# Patient Record
Sex: Female | Born: 2000 | Race: Black or African American | Hispanic: No | Marital: Single | Smoking: Never smoker
Health system: Southern US, Community
[De-identification: ages and names within clinical notes are randomized; demographics above are authoritative.]

## PROBLEM LIST (undated history)

## (undated) HISTORY — PX: OTHER SURGICAL HISTORY: SHX169

## (undated) HISTORY — PX: GASTRIC BYPASS: SHX52

---
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
## 2020-01-13 ENCOUNTER — Encounter (HOSPITAL_COMMUNITY): Payer: Self-pay

## 2020-01-13 ENCOUNTER — Emergency Department (HOSPITAL_COMMUNITY): Payer: Medicaid Other

## 2020-01-13 ENCOUNTER — Inpatient Hospital Stay (HOSPITAL_COMMUNITY)
Admission: EM | Admit: 2020-01-13 | Discharge: 2020-01-17 | DRG: 759 | Disposition: A | Discharge status: Home / Self Care | Payer: Medicaid Other | Attending: Obstetrics and Gynecology | Admitting: Obstetrics and Gynecology

## 2020-01-13 ENCOUNTER — Other Ambulatory Visit: Payer: Self-pay

## 2020-01-13 DIAGNOSIS — Z9884 Bariatric surgery status: Secondary | ICD-10-CM | POA: Diagnosis not present

## 2020-01-13 DIAGNOSIS — R1030 Lower abdominal pain, unspecified: Secondary | ICD-10-CM | POA: Diagnosis not present

## 2020-01-13 DIAGNOSIS — N7093 Salpingitis and oophoritis, unspecified: Secondary | ICD-10-CM | POA: Diagnosis present

## 2020-01-13 DIAGNOSIS — R102 Pelvic and perineal pain: Secondary | ICD-10-CM

## 2020-01-13 DIAGNOSIS — Z20822 Contact with and (suspected) exposure to covid-19: Secondary | ICD-10-CM | POA: Diagnosis present

## 2020-01-13 DIAGNOSIS — K5900 Constipation, unspecified: Secondary | ICD-10-CM | POA: Diagnosis present

## 2020-01-13 LAB — URINALYSIS, ROUTINE W REFLEX MICROSCOPIC
Bilirubin Urine: NEGATIVE
Glucose, UA: NEGATIVE mg/dL
Ketones, ur: NEGATIVE mg/dL
Leukocytes,Ua: NEGATIVE
Nitrite: NEGATIVE
Protein, ur: NEGATIVE mg/dL
Specific Gravity, Urine: 1.016 (ref 1.005–1.030)
pH: 5 (ref 5.0–8.0)

## 2020-01-13 LAB — CBC
HCT: 39.8 % (ref 36.0–46.0)
Hemoglobin: 13.1 g/dL (ref 12.0–15.0)
MCH: 28.5 pg (ref 26.0–34.0)
MCHC: 32.9 g/dL (ref 30.0–36.0)
MCV: 86.7 fL (ref 80.0–100.0)
Platelets: 207 10*3/uL (ref 150–400)
RBC: 4.59 MIL/uL (ref 3.87–5.11)
RDW: 12.4 % (ref 11.5–15.5)
WBC: 13.5 10*3/uL — ABNORMAL HIGH (ref 4.0–10.5)
nRBC: 0 % (ref 0.0–0.2)

## 2020-01-13 LAB — COMPREHENSIVE METABOLIC PANEL
ALT: 15 U/L (ref 0–44)
AST: 15 U/L (ref 15–41)
Albumin: 4.1 g/dL (ref 3.5–5.0)
Alkaline Phosphatase: 82 U/L (ref 38–126)
Anion gap: 9 (ref 5–15)
BUN: 7 mg/dL (ref 6–20)
CO2: 25 mmol/L (ref 22–32)
Calcium: 8.9 mg/dL (ref 8.9–10.3)
Chloride: 99 mmol/L (ref 98–111)
Creatinine, Ser: 0.8 mg/dL (ref 0.44–1.00)
GFR calc Af Amer: >60 mL/min (ref >60)
GFR calc non Af Amer: >60 mL/min (ref >60)
Glucose, Bld: 108 mg/dL — ABNORMAL HIGH (ref 70–99)
Potassium: 3.5 mmol/L (ref 3.5–5.1)
Sodium: 133 mmol/L — ABNORMAL LOW (ref 135–145)
Total Bilirubin: 2.2 mg/dL — ABNORMAL HIGH (ref 0.3–1.2)
Total Protein: 8 g/dL (ref 6.5–8.1)

## 2020-01-13 LAB — RESPIRATORY PANEL BY RT PCR (FLU A&B, COVID)
Influenza A by PCR: NEGATIVE
Influenza B by PCR: NEGATIVE
SARS Coronavirus 2 by RT PCR: NEGATIVE

## 2020-01-13 LAB — WET PREP, GENITAL
Sperm: NONE SEEN
Trich, Wet Prep: NONE SEEN
Yeast Wet Prep HPF POC: NONE SEEN

## 2020-01-13 LAB — I-STAT BETA HCG BLOOD, ED (MC, WL, AP ONLY): I-stat hCG, quantitative: <5 m[IU]/mL (ref <5)

## 2020-01-13 LAB — LACTIC ACID, PLASMA: Lactic Acid, Venous: 1.7 mmol/L (ref 0.5–1.9)

## 2020-01-13 LAB — LIPASE, BLOOD: Lipase: 19 U/L (ref 11–51)

## 2020-01-13 MED ORDER — SODIUM CHLORIDE 0.9% FLUSH: 3.0000 mL | Freq: Once | INTRAVENOUS | Status: DC

## 2020-01-13 MED ORDER — PIPERACILLIN-TAZOBACTAM 3.375 G IVPB 30 MIN
3.3750 g | Freq: Once | INTRAVENOUS | Status: AC
  Administered 2020-01-13: 3.375 g via INTRAVENOUS
  Filled 2020-01-13: qty 50

## 2020-01-13 MED ORDER — DOXYCYCLINE HYCLATE 100 MG PO TABS: 100.0000 mg | ORAL_TABLET | Freq: Two times a day (BID) | ORAL | Status: DC

## 2020-01-13 MED ORDER — DOCUSATE SODIUM 100 MG PO CAPS
100.0000 mg | ORAL_CAPSULE | Freq: Two times a day (BID) | ORAL | Status: DC
  Administered 2020-01-14 – 2020-01-17 (×7): 100 mg via ORAL
  Filled 2020-01-13 (×7): qty 1

## 2020-01-13 MED ORDER — ONDANSETRON 4 MG PO TBDP
4.0000 mg | ORAL_TABLET | Freq: Once | ORAL | Status: AC
  Administered 2020-01-13: 4 mg via ORAL
  Filled 2020-01-13: qty 1

## 2020-01-13 MED ORDER — SODIUM CHLORIDE 0.9 % IV SOLN: 1.0000 g | Freq: Once | INTRAVENOUS | Status: DC

## 2020-01-13 MED ORDER — SODIUM CHLORIDE 0.9 % IV BOLUS
1000.0000 mL | Freq: Once | INTRAVENOUS | Status: AC
  Administered 2020-01-13: 1000 mL via INTRAVENOUS

## 2020-01-13 MED ORDER — ONDANSETRON HCL 4 MG PO TABS
4.0000 mg | ORAL_TABLET | Freq: Four times a day (QID) | ORAL | Status: DC | PRN
  Administered 2020-01-14 – 2020-01-15 (×3): 4 mg via ORAL
  Filled 2020-01-13 (×3): qty 1

## 2020-01-13 MED ORDER — ONDANSETRON HCL 4 MG/2ML IJ SOLN
4.0000 mg | Freq: Four times a day (QID) | INTRAMUSCULAR | Status: DC | PRN
  Administered 2020-01-15: 4 mg via INTRAVENOUS
  Filled 2020-01-13: qty 2

## 2020-01-13 MED ORDER — HYDROCODONE-ACETAMINOPHEN 5-325 MG PO TABS
1.0000 | ORAL_TABLET | ORAL | Status: DC | PRN
  Administered 2020-01-14 – 2020-01-15 (×5): 2 via ORAL
  Filled 2020-01-13 (×5): qty 2

## 2020-01-13 MED ORDER — DOXYCYCLINE HYCLATE 100 MG PO TABS
100.0000 mg | ORAL_TABLET | Freq: Two times a day (BID) | ORAL | Status: DC
  Administered 2020-01-13 – 2020-01-17 (×8): 100 mg via ORAL
  Filled 2020-01-13 (×9): qty 1

## 2020-01-13 MED ORDER — ACETAMINOPHEN 500 MG PO TABS
1000.0000 mg | ORAL_TABLET | Freq: Once | ORAL | Status: AC
  Administered 2020-01-13: 1000 mg via ORAL
  Filled 2020-01-13: qty 2

## 2020-01-13 MED ORDER — ALUM & MAG HYDROXIDE-SIMETH 200-200-20 MG/5ML PO SUSP: 30.0000 mL | ORAL | Status: DC | PRN

## 2020-01-13 MED ORDER — LACTATED RINGERS IV SOLN: INTRAVENOUS | Status: DC

## 2020-01-13 MED ORDER — MORPHINE SULFATE (PF) 2 MG/ML IV SOLN
1.0000 mg | INTRAVENOUS | Status: DC | PRN
  Administered 2020-01-14: 2 mg via INTRAVENOUS
  Administered 2020-01-14: 1 mg via INTRAVENOUS
  Administered 2020-01-14 – 2020-01-17 (×7): 2 mg via INTRAVENOUS
  Filled 2020-01-13 (×10): qty 1

## 2020-01-13 MED ORDER — IOHEXOL 300 MG/ML  SOLN
100.0000 mL | Freq: Once | INTRAMUSCULAR | Status: AC | PRN
  Administered 2020-01-13: 100 mL via INTRAVENOUS

## 2020-01-13 MED ORDER — IBUPROFEN 800 MG PO TABS
800.0000 mg | ORAL_TABLET | Freq: Once | ORAL | Status: AC
  Administered 2020-01-13: 800 mg via ORAL
  Filled 2020-01-13: qty 1

## 2020-01-13 MED ORDER — SIMETHICONE 80 MG PO CHEW
80.0000 mg | CHEWABLE_TABLET | Freq: Four times a day (QID) | ORAL | Status: DC | PRN
  Administered 2020-01-14: 80 mg via ORAL
  Filled 2020-01-13 (×2): qty 1

## 2020-01-13 MED ORDER — ACETAMINOPHEN 325 MG PO TABS: 650.0000 mg | ORAL_TABLET | ORAL | Status: DC | PRN

## 2020-01-13 MED ORDER — SODIUM CHLORIDE (PF) 0.9 % IJ SOLN
INTRAMUSCULAR | Status: AC
  Filled 2020-01-13: qty 50

## 2020-01-13 MED ORDER — FENTANYL CITRATE (PF) 100 MCG/2ML IJ SOLN
50.0000 ug | Freq: Once | INTRAMUSCULAR | Status: AC
  Administered 2020-01-13: 50 ug via INTRAVENOUS
  Filled 2020-01-13: qty 2

## 2020-01-13 MED ORDER — SODIUM CHLORIDE 0.9 % IV SOLN
2.0000 g | Freq: Four times a day (QID) | INTRAVENOUS | Status: DC
  Administered 2020-01-13: 2 g via INTRAVENOUS
  Filled 2020-01-13 (×2): qty 2

## 2020-01-13 NOTE — ED Provider Notes (Signed)
[facility] DEPT Provider Note   CSN: [id number] Arrival date & time: 01/13/20  1527     History Chief Complaint  Patient presents with  . Abdominal Pain    Jennifer Merritt is a 19 y.o. female.  Who presents emergency department with a chief complaint of abdominal pain.  She is status post gastric bypass surgery in 2019 and abdominoplasty in the summer 2020.  The patient states that for the past 3 days she has been having abdominal pain.  She felt like she may have eaten too much on Friday.  She had pain in the epigastrium which has now migrated to her lower abdomen.  She states that the pain has progressively worsened.  She was not making a bowel movement and has not had much to eat or drink over the past 24 hours.  She took magnesium citrate last night and had of voluminous watery stools overnight.  This morning her pain became severe.  She states it hurts to lay flat, move, take deep breaths.  She states that any small jostling of her abdomen hurts extremely.  She denies any urinary symptoms but does feel like she may have some lower back pain.  She has been having shaking chills did not realize that she had a fever prior to arrival.  She denies hematuria or burning with urination.  Patient denies any recent sexual activity and is only sexually active with females.  She has recent STD testing and denies any vaginal symptoms. She has had nausea without vomiting.  HPI     History reviewed. No pertinent past medical history.  There are no problems to display for this patient.   Past Surgical History:  Procedure Laterality Date  . GASTRIC BYPASS    . tummy tuck       OB History   No obstetric history on file.     Family History  Problem Relation Age of Onset  . Hypertension Mother     Social History   Tobacco Use  . Smoking status: Never Smoker  . Smokeless tobacco: Never Used  Substance Use Topics  . Alcohol use: Never  . Drug use: Yes   Types: Marijuana    Home Medications Prior to Admission medications   Not on File    Allergies    Patient has no known allergies.  Review of Systems   Review of Systems Ten systems reviewed and are negative for acute change, except as noted in the HPI.  Physical Exam Updated Vital Signs BP (!) 124/103 (BP Location: Left Arm)   Pulse (!) 102   Temp (!) 101.2 F (38.4 C) (Oral)   Resp 16   Ht 5\' 1"  (1.549 m)   Wt 82.6 kg   LMP 01/06/2020   SpO2 94%   BMI 34.39 kg/m   Physical Exam Vitals and nursing note reviewed.  Constitutional:      Appearance: She is well-developed.  HENT:     Head: Normocephalic and atraumatic.     Mouth/Throat:     Mouth: Mucous membranes are moist.  Eyes:     Extraocular Movements: Extraocular movements intact.     Pupils: Pupils are equal, round, and reactive to light.  Cardiovascular:     Rate and Rhythm: Tachycardia present.     Heart sounds: Murmur present.  Pulmonary:     Effort: Pulmonary effort is normal.     Breath sounds: Normal breath sounds.  Abdominal:     General: Abdomen is flat.  A surgical scar is present. There is no distension.     Palpations: Abdomen is soft.     Tenderness: There is generalized abdominal tenderness. There is guarding and rebound. There is no right CVA tenderness or left CVA tenderness.     Comments: Patient with lower abdominal scar and umbilical scar consistent with previous abdominoplasty.  She has generalized abdominal tenderness, guarding, she is exquisitely tender with even the lightest palpation.  Positive peritoneal signs.  Neurological:     Mental Status: She is alert.     ED Results / Procedures / Treatments   Labs (all labs ordered are listed, but only abnormal results are displayed) Labs Reviewed  RESPIRATORY PANEL BY RT PCR (FLU A&B, COVID)  LIPASE, BLOOD  COMPREHENSIVE METABOLIC PANEL  CBC  URINALYSIS, ROUTINE W REFLEX MICROSCOPIC  LACTIC ACID, PLASMA  I-STAT BETA HCG BLOOD, ED ([facility],  [facility], [facility] ONLY)    EKG None  Radiology No results found.  Procedures Procedures (including critical care time)  Medications Ordered in ED Medications  piperacillin-tazobactam (ZOSYN) IVPB 3.375 g (3.375 g Intravenous New Bag/Given 01/13/20 1719)  sodium chloride 0.9 % bolus 1,000 mL (1,000 mLs Intravenous New Bag/Given 01/13/20 1719)  acetaminophen (TYLENOL) tablet 1,000 mg (1,000 mg Oral Given 01/13/20 1719)  ondansetron (ZOFRAN-ODT) disintegrating tablet 4 mg (4 mg Oral Given 01/13/20 1719)  fentaNYL (SUBLIMAZE) injection 50 mcg (50 mcg Intravenous Given 01/13/20 1719)    ED Course  I have reviewed the triage vital signs and the nursing notes.  Pertinent labs & imaging results that were available during my care of the patient were reviewed by me and considered in my medical decision making (see chart for details).  Clinical Course as of Jan 13 17  Mon Jan 13, 2020  1750 WBC(!): 13.5 [AH]  1750 Bacteria, UA(!): RARE [AH]  1750 Hgb urine dipstick(!): SMALL [AH]  1750 Appearance(!): HAZY [AH]  1809 Total Bilirubin(!): 2.2 [AH]  2028 US PELVIC COMPLETE W TRANSVAGINAL AND TORSION R/O [AH]    Clinical Course User Index [AH] Arthor Captain, PA-C   MDM Rules/Calculators/A&P                      5:26 PM BP (!) 128/98   Pulse 90   Temp (!) 101.2 F (38.4 C) (Oral)   Resp 16   Ht 5\' 1"  (1.549 m)   Wt 82.6 kg   LMP 01/06/2020   SpO2 96%   BMI 34.58 kg/m  19 year old female here with abdominal pain.  She is running a fever of 101.2.  Her physical exam is concerning for acute abdomen/surgical abdomen.  She has exquisite tenderness to palpation and positive peritoneal signs.  I started the patient on fluids given oral Tylenol, pain medication, antinausea medication and Zosyn.    CC: Abdominal pain VS:  Vitals:   01/13/20 2100 01/13/20 2137 01/13/20 2253 01/13/20 2300  BP:  (!) 113/49 (!) 104/39 (!) 102/55  Pulse:  100 81 80  Resp:  18 18   Temp: (!) 100.4 F (38 C)       TempSrc: Oral     SpO2:  100% 99% 100%  Weight:      Height:        03/14/20 is gathered by patient and EMR. Previous records obtained and reviewed. DDX:The patient's complaint of abdominal pain involves an extensive number of diagnostic and treatment options, and is a complaint that carries with it a high risk of complications, morbidity, and potential  mortality. Given the large differential diagnosis, medical decision making is of high complexity. Differential diagnosis of her lower abdominal considerations include pelvic inflammatory disease, ectopic pregnancy, appendicitis, urinary calculi, primary dysmenorrhea, septic abortion, ruptured ovarian cyst or tumor, ovarian torsion, tubo-ovarian abscess, degeneration of fibroid, endometriosis, diverticulitis, cystitis. Labs: I ordered reviewed and interpreted labs which include i-STAT hCG which is negative for acute abnormality.  CMP shows slightly low sodium of insignificant value.  CBC with elevated white blood cell count.  Patient with a mildly elevated total bilirubin.  Urine shows no acute abnormalities.  Covid test negative.  Wet prep collected and shows clue cells and many white blood cells. Imaging: I ordered and reviewed images which included CT abdomen and pelvis with contrast along with ultrasound of the pelvis with transvaginal duplex. I independently visualized and interpreted all imaging. Significant findings include CT scan shows complex rim-enhancing lesions within the bilateral ovaries.  This is also confirmed on pelvic ultrasound which is showing also a hydrosalpinx concerning for tubo-ovarian abscess EKG: Consults: Case discussed with Dr. Jeanann Lewandowsky OB/GYN who will admit the patient. MDM: Patient here with severe pelvic pain, fever, peritoneal symptoms.  Initially had concern for potential ruptured appendicitis however CT concerning for pelvic organ dysfunction function.  Exquisitely tender on pelvic examination and ultrasound confirms  suspected tubo-ovarian abscess.  Patient started on cefoxitin and oral doxycycline.  Continued on pain medications.  She will be admitted for further management.  I have discussed all findings with the patient. Patient disposition: Admit The patient appears reasonably stabilized for admission considering the current resources, flow, and capabilities available in the ED at this time, and I doubt any other [facility] requiring further screening and/or treatment in the ED prior to admission.       ' Final Clinical Impression(s) / ED Diagnoses Final diagnoses:  None    Rx / DC Orders ED Discharge Orders    None       Arthor Captain, PA-C 01/14/20 Dorene Ar, MD 01/14/20 1510

## 2020-01-13 NOTE — ED Triage Notes (Signed)
Patient c/o lower abdominal pain x 2 days. Patient states she was constipated x 2 days as well, but had a BM last night after taking Mag Citrate. patient denies any N/V. Patient went to the [facility] today and was advised to come to the ED  Patient had a Negative Rapid Covid and they also obtained another Covid test and told her her results should be ready tomorrow or Wednesday.Marland Kitchen

## 2020-01-14 DIAGNOSIS — N7093 Salpingitis and oophoritis, unspecified: Principal | ICD-10-CM

## 2020-01-14 LAB — CBC
HCT: 37.6 % (ref 36.0–46.0)
Hemoglobin: 12.1 g/dL (ref 12.0–15.0)
MCH: 28.5 pg (ref 26.0–34.0)
MCHC: 32.2 g/dL (ref 30.0–36.0)
MCV: 88.7 fL (ref 80.0–100.0)
Platelets: 198 10*3/uL (ref 150–400)
RBC: 4.24 MIL/uL (ref 3.87–5.11)
RDW: 12.7 % (ref 11.5–15.5)
WBC: 11 10*3/uL — ABNORMAL HIGH (ref 4.0–10.5)
nRBC: 0 % (ref 0.0–0.2)

## 2020-01-14 MED ORDER — METRONIDAZOLE 500 MG PO TABS
500.0000 mg | ORAL_TABLET | Freq: Two times a day (BID) | ORAL | Status: DC
  Administered 2020-01-14 – 2020-01-17 (×7): 500 mg via ORAL
  Filled 2020-01-14 (×7): qty 1

## 2020-01-14 MED ORDER — SODIUM CHLORIDE 0.9 % IV SOLN
2.0000 g | Freq: Two times a day (BID) | INTRAVENOUS | Status: DC
  Administered 2020-01-14 – 2020-01-17 (×7): 2 g via INTRAVENOUS
  Filled 2020-01-14 (×8): qty 2

## 2020-01-14 MED ORDER — ZOLPIDEM TARTRATE 5 MG PO TABS: 5.0000 mg | ORAL_TABLET | Freq: Every evening | ORAL | Status: DC | PRN

## 2020-01-14 NOTE — H&P (Signed)
Jennifer Merritt is an 19 y.o. female who started having lower abd pain over the weekend. Pain increased and she presented to [facility] ED for eval. Eval revealed bilateral TOA's. Jennifer Merritt was transfer to our service for treatment.  Denies Bladder dysfunction. Some problems with constipation.   Jennifer Merritt sexual active, in same sex relationship Denies H/O STD's Nulligravid  LMP 01/05/20  Menstrual History: Menarche age: 54 Patient's last menstrual period was 01/06/2020.    History reviewed. No pertinent past medical history.  Past Surgical History:  Procedure Laterality Date  . GASTRIC BYPASS    . tummy tuck      Family History  Problem Relation Age of Onset  . Hypertension Mother     Social History:  reports that she has never smoked. She has never used smokeless tobacco. She reports current drug use. Drug: Marijuana. She reports that she does not drink alcohol.  Allergies: No Known Allergies  No medications prior to admission.    Review of Systems  Constitutional: Positive for chills and fever.  Respiratory: Negative.   Cardiovascular: Negative.   Gastrointestinal: Positive for abdominal pain and constipation.  Genitourinary: Positive for pelvic pain.    Blood pressure 114/78, pulse (!) 104, temperature (!) 100.4 F (38 C), temperature source Oral, resp. rate 18, height 5\' 1"  (1.549 m), weight 82.1 kg, last menstrual period 01/06/2020, SpO2 100 %. Physical Exam  Constitutional: She appears well-developed and well-nourished.  Cardiovascular: Normal rate and regular rhythm.  Respiratory: Effort normal and breath sounds normal.  GI: Soft. Bowel sounds are normal.  Diffuse lower abd tenderness no rebound or gaurding  Genitourinary:    Genitourinary Comments: See ER exam     Results for orders placed or performed during the hospital encounter of 01/13/20 (from the past 24 hour(s))  Urinalysis, Routine w reflex microscopic     Status: Abnormal   Collection Time: 01/13/20  4:59 PM   Result Value Ref Range   Color, Urine YELLOW YELLOW   APPearance HAZY (A) CLEAR   Specific Gravity, Urine 1.016 1.005 - 1.030   pH 5.0 5.0 - 8.0   Glucose, UA NEGATIVE NEGATIVE mg/dL   Hgb urine dipstick SMALL (A) NEGATIVE   Bilirubin Urine NEGATIVE NEGATIVE   Ketones, ur NEGATIVE NEGATIVE mg/dL   Protein, ur NEGATIVE NEGATIVE mg/dL   Nitrite NEGATIVE NEGATIVE   Leukocytes,Ua NEGATIVE NEGATIVE   RBC / HPF 0-5 0 - 5 RBC/hpf   WBC, UA 6-10 0 - 5 WBC/hpf   Bacteria, UA RARE (A) NONE SEEN   Squamous Epithelial / LPF 6-10 0 - 5   Mucus PRESENT   Respiratory Panel by RT PCR (Flu A&B, Covid) - Nasopharyngeal Swab     Status: None   Collection Time: 01/13/20  5:18 PM   Specimen: Nasopharyngeal Swab  Result Value Ref Range   SARS Coronavirus 2 by RT PCR NEGATIVE NEGATIVE   Influenza A by PCR NEGATIVE NEGATIVE   Influenza B by PCR NEGATIVE NEGATIVE  Lipase, blood     Status: None   Collection Time: 01/13/20  5:21 PM  Result Value Ref Range   Lipase 19 11 - 51 U/L  Comprehensive metabolic panel     Status: Abnormal   Collection Time: 01/13/20  5:21 PM  Result Value Ref Range   Sodium 133 (L) 135 - 145 mmol/L   Potassium 3.5 3.5 - 5.1 mmol/L   Chloride 99 98 - 111 mmol/L   CO2 25 22 - 32 mmol/L   Glucose, Bld 108 (  H) 70 - 99 mg/dL   BUN 7 6 - 20 mg/dL   Creatinine, Ser 0.80 0.44 - 1.00 mg/dL   Calcium 8.9 8.9 - 10.3 mg/dL   Total Protein 8.0 6.5 - 8.1 g/dL   Albumin 4.1 3.5 - 5.0 g/dL   AST 15 15 - 41 U/L   ALT 15 0 - 44 U/L   Alkaline Phosphatase 82 38 - 126 U/L   Total Bilirubin 2.2 (H) 0.3 - 1.2 mg/dL   GFR calc non Af Amer >60 >60 mL/min   GFR calc Af Amer >60 >60 mL/min   Anion gap 9 5 - 15  CBC     Status: Abnormal   Collection Time: 01/13/20  5:21 PM  Result Value Ref Range   WBC 13.5 (H) 4.0 - 10.5 K/uL   RBC 4.59 3.87 - 5.11 MIL/uL   Hemoglobin 13.1 12.0 - 15.0 g/dL   HCT 39.8 36.0 - 46.0 %   MCV 86.7 80.0 - 100.0 fL   MCH 28.5 26.0 - 34.0 pg   MCHC 32.9 30.0 -  36.0 g/dL   RDW 12.4 11.5 - 15.5 %   Platelets 207 150 - 400 K/uL   nRBC 0.0 0.0 - 0.2 %  Lactic acid, plasma     Status: None   Collection Time: 01/13/20  5:21 PM  Result Value Ref Range   Lactic Acid, Venous 1.7 0.5 - 1.9 mmol/L  I-Stat beta hCG blood, ED     Status: None   Collection Time: 01/13/20  5:28 PM  Result Value Ref Range   I-stat hCG, quantitative <5.0 <5 mIU/mL   Comment 3          Wet prep, genital     Status: Abnormal   Collection Time: 01/13/20  8:25 PM   Specimen: PATH Cytology Cervicovaginal Ancillary Only  Result Value Ref Range   Yeast Wet Prep HPF POC NONE SEEN NONE SEEN   Trich, Wet Prep NONE SEEN NONE SEEN   Clue Cells Wet Prep HPF POC PRESENT (A) NONE SEEN   WBC, Wet Prep HPF POC MANY (A) NONE SEEN   Sperm NONE SEEN   CBC     Status: Abnormal   Collection Time: 01/14/20  9:28 AM  Result Value Ref Range   WBC 11.0 (H) 4.0 - 10.5 K/uL   RBC 4.24 3.87 - 5.11 MIL/uL   Hemoglobin 12.1 12.0 - 15.0 g/dL   HCT 37.6 36.0 - 46.0 %   MCV 88.7 80.0 - 100.0 fL   MCH 28.5 26.0 - 34.0 pg   MCHC 32.2 30.0 - 36.0 g/dL   RDW 12.7 11.5 - 15.5 %   Platelets 198 150 - 400 K/uL   nRBC 0.0 0.0 - 0.2 %    CT ABDOMEN PELVIS W CONTRAST  Result Date: 01/13/2020 CLINICAL DATA:  Lower abdomen pain with fever history of gastric bypass EXAM: CT ABDOMEN AND PELVIS WITH CONTRAST TECHNIQUE: Multidetector CT imaging of the abdomen and pelvis was performed using the standard protocol following bolus administration of intravenous contrast. CONTRAST:  147mL OMNIPAQUE IOHEXOL 300 MG/ML  SOLN COMPARISON:  None. FINDINGS: Lower chest: Lung bases demonstrate no acute consolidation or pleural effusion. A punctate focus of gas within the right anterior chest wall appears external to the pleural space and peritoneal cavity. Hepatobiliary: No focal liver abnormality is seen. No gallstones, gallbladder wall thickening, or biliary dilatation. Pancreas: Unremarkable. No pancreatic ductal dilatation or  surrounding inflammatory changes. Spleen: Normal in size without focal  abnormality. Adrenals/Urinary Tract: Adrenal glands are unremarkable. Kidneys are normal, without renal calculi, focal lesion, or hydronephrosis. Bladder is unremarkable. Stomach/Bowel: Status post gastric bypass. Appendix appears normal. No evidence of bowel wall thickening, distention, or inflammatory changes. Vascular/Lymphatic: No significant vascular findings are present. No enlarged abdominal or pelvic lymph nodes. Reproductive: Uterus unremarkable. Multiple rim enhancing lesions within the bilateral adnexa. On the left this measures approximately 5.8 cm and on the right this measures approximately 6.5 cm. Other: Small free fluid in the pelvis and adjacent to the liver. No free air. Thickening of the umbilicus. Musculoskeletal: No acute or suspicious osseous abnormality. IMPRESSION: 1. Multiple bilateral rim enhancing lesions within the pelvis/bilateral adnexa, indeterminate for complex bilateral ovarian cysts versus tubo-ovarian abscess. 2. Small free fluid within the pelvis and right upper quadrant Electronically Signed   By: Jasmine Pang M.D.   On: 01/13/2020 18:51   US PELVIC COMPLETE W TRANSVAGINAL AND TORSION R/O  Addendum Date: 01/13/2020   ADDENDUM REPORT: 01/13/2020 20:07 ADDENDUM: Not mentioned above: Multiple bilateral ovarian follicles. Electronically Signed   By: Elige Ko   On: 01/13/2020 20:07   Result Date: 01/13/2020 CLINICAL DATA:  Pelvic pain for 4 days EXAM: TRANSABDOMINAL AND TRANSVAGINAL ULTRASOUND OF PELVIS DOPPLER ULTRASOUND OF OVARIES TECHNIQUE: Both transabdominal and transvaginal ultrasound examinations of the pelvis were performed. Transabdominal technique was performed for global imaging of the pelvis including uterus, ovaries, adnexal regions, and pelvic cul-de-sac. It was necessary to proceed with endovaginal exam following the transabdominal exam to visualize the endometrium and ovaries. Color and  duplex Doppler ultrasound was utilized to evaluate blood flow to the ovaries. COMPARISON:  None. FINDINGS: Uterus Measurements: 7.4 x 2.9 x 3.4 cm = volume: 37.6 mL. No fibroids or other mass visualized. Endometrium Thickness: 4.6 mm.  No focal abnormality visualized. Right ovary Measurements: 4.8 x 4.2 x 4.4 cm = volume: 46.8 mL. 2.8 x 2.3 x 2.2 cm hypodense right ovarian mass with multiple septations which may reflect an endometrioma versus hemorrhagic cyst. Left ovary Measurements: 5.7 x 4.4 x 4.9 cm = volume: 63.1 mL. 3.8 x 3 x 2.7 cm complex hypoechoic left ovarian mass which may reflect an endometrioma versus hemorrhagic cyst. Pulsed Doppler evaluation of both ovaries demonstrates normal low-resistance arterial and venous waveforms. Other findings No pelvic free fluid. Bilateral tubular structures likely reflecting hydrosalpinx. IMPRESSION: 1. 2.8 x 2.3 x 2.2 cm hypodense right ovarian mass with multiple septations which may reflect an endometrioma versus hemorrhagic cyst. 2. 3.8 x 3 x 2.7 cm complex hypoechoic left ovarian mass which may reflect an endometrioma versus hemorrhagic cyst. 3. Short-interval follow up ultrasound in 6-12 weeks is recommended, preferably during the week following the patient's normal menses. 4. Bilateral hydrosalpinx. Electronically Signed: By: Elige Ko On: 01/13/2020 20:00    Assessment/Plan: Bilateral TOA  Dx reviewed with Jennifer Merritt. Plan for IV antibiotics for the next 48-72 hours. If pain resolves will discharge home on oral antibiotics. If Jennifer Merritt's condition does not improve, consider IR consult for drainage and drain placement.  POC reviewed with Jennifer Merritt. Jennifer Merritt verbalized understanding and agreement  Hermina Staggers 01/14/2020, 12:14 PM

## 2020-01-15 LAB — CBC WITH DIFFERENTIAL/PLATELET
Abs Immature Granulocytes: 0 10*3/uL (ref 0.00–0.07)
Basophils Absolute: 0 10*3/uL (ref 0.0–0.1)
Basophils Relative: 0 %
Eosinophils Absolute: 0 10*3/uL (ref 0.0–0.5)
Eosinophils Relative: 0 %
HCT: 32.7 % — ABNORMAL LOW (ref 36.0–46.0)
Hemoglobin: 10.7 g/dL — ABNORMAL LOW (ref 12.0–15.0)
Lymphocytes Relative: 13 %
Lymphs Abs: 1.4 10*3/uL (ref 0.7–4.0)
MCH: 28.5 pg (ref 26.0–34.0)
MCHC: 32.7 g/dL (ref 30.0–36.0)
MCV: 87 fL (ref 80.0–100.0)
Monocytes Absolute: 0.6 10*3/uL (ref 0.1–1.0)
Monocytes Relative: 5 %
Neutro Abs: 9.1 10*3/uL — ABNORMAL HIGH (ref 1.7–7.7)
Neutrophils Relative %: 82 %
Platelets: 195 10*3/uL (ref 150–400)
RBC: 3.76 MIL/uL — ABNORMAL LOW (ref 3.87–5.11)
RDW: 12.6 % (ref 11.5–15.5)
WBC: 11.1 10*3/uL — ABNORMAL HIGH (ref 4.0–10.5)
nRBC: 0 % (ref 0.0–0.2)
nRBC: 0 /100 WBC

## 2020-01-15 LAB — GC/CHLAMYDIA PROBE AMP (~~LOC~~) NOT AT ARMC
Chlamydia: NEGATIVE
Comment: NEGATIVE
Comment: NORMAL
Neisseria Gonorrhea: NEGATIVE

## 2020-01-15 MED ORDER — HYDROMORPHONE HCL 2 MG PO TABS
2.0000 mg | ORAL_TABLET | ORAL | Status: DC | PRN
  Administered 2020-01-16 (×4): 2 mg via ORAL
  Filled 2020-01-15 (×4): qty 1

## 2020-01-15 NOTE — Plan of Care (Signed)
Problem: Education: Goal: Knowledge of General Education information will improve Description: Including pain rating scale, medication(s)/side effects and non-pharmacologic comfort measures Outcome: Progressing  Pt able to voice concerns and ask questions.    Problem: Coping: Goal: Level of anxiety will decrease Outcome: Progressing  Problem: Pain Managment: Goal: General experience of comfort will improve Outcome: Progressing Pt able to verbalize effective ways to relieve pain in non-pharmacological way

## 2020-01-15 NOTE — Progress Notes (Signed)
HD # 1 bilateral TOA  Subjective: Pt still reports having abd pain. She has moved her bowels x 2 . Tolerating some diet but not much of an appetite. Up to rest room without problems  Objective: AF VSS Lungs clear Heart RRR Abd soft + BS diffuse tenderness no rebound Ext non tender  Assessment/Plan: Stable. BC negative x 24 hrs. WBC improving but still with pain. Continue with antibiotics for 48-72 hrs. GC/C pending. Consider IR if pain does not resolve.   LOS: 2 days    Jennifer Merritt 01/15/2020, 10:40 AM

## 2020-01-16 LAB — CBC
HCT: 31.2 % — ABNORMAL LOW (ref 36.0–46.0)
Hemoglobin: 10.4 g/dL — ABNORMAL LOW (ref 12.0–15.0)
MCH: 28.4 pg (ref 26.0–34.0)
MCHC: 33.3 g/dL (ref 30.0–36.0)
MCV: 85.2 fL (ref 80.0–100.0)
Platelets: 205 10*3/uL (ref 150–400)
RBC: 3.66 MIL/uL — ABNORMAL LOW (ref 3.87–5.11)
RDW: 12.7 % (ref 11.5–15.5)
WBC: 13.1 10*3/uL — ABNORMAL HIGH (ref 4.0–10.5)
nRBC: 0 % (ref 0.0–0.2)

## 2020-01-16 MED ORDER — PHENAZOPYRIDINE HCL 200 MG PO TABS
200.0000 mg | ORAL_TABLET | Freq: Three times a day (TID) | ORAL | Status: DC
  Filled 2020-01-16: qty 1

## 2020-01-16 MED ORDER — PHENAZOPYRIDINE HCL 200 MG PO TABS
200.0000 mg | ORAL_TABLET | Freq: Three times a day (TID) | ORAL | Status: DC
  Administered 2020-01-16 – 2020-01-17 (×2): 200 mg via ORAL
  Filled 2020-01-16 (×3): qty 1

## 2020-01-16 MED ORDER — PANTOPRAZOLE SODIUM 40 MG IV SOLR
40.0000 mg | INTRAVENOUS | Status: DC
  Administered 2020-01-16 – 2020-01-17 (×2): 40 mg via INTRAVENOUS
  Filled 2020-01-16 (×2): qty 40

## 2020-01-16 MED ORDER — GABAPENTIN 100 MG PO CAPS
100.0000 mg | ORAL_CAPSULE | Freq: Three times a day (TID) | ORAL | Status: DC
  Administered 2020-01-16 – 2020-01-17 (×4): 100 mg via ORAL
  Filled 2020-01-16 (×4): qty 1

## 2020-01-16 NOTE — Progress Notes (Signed)
Pt complaining of abdominal spasms, burning on urination, and bloody urine. Dr. Jolayne Panther notified and made aware of patients CBC results and orders received.  Urine culture sent to lab at 1200.

## 2020-01-16 NOTE — Progress Notes (Signed)
Patient ID: Laurie Penado, female   DOB: 2001/08/20, 19 y.o.   MRN: [id number] HD # 2 bilateral TOA  Subjective: Pt still reports persistent abdominal pain unchanged since admission. She is ambulating, voiding and has had bowel movements. She denies nausea or emesis and is tolerating a regular diet.   Objective: Blood pressure 138/67, pulse 89, temperature 98 F (36.7 C), temperature source Oral, resp. rate 18, height 5\' 1"  (1.549 m), weight 82.1 kg, last menstrual period 01/06/2020, SpO2 99 %.  GENERAL: Well-developed, well-nourished female in no acute distress.  LUNGS: Clear to auscultation bilaterally.  HEART: Regular rate and rhythm. ABDOMEN: Soft, diffuse abdominal tenderness, nondistended.  PELVIC: Not indicated EXTREMITIES: No cyanosis, clubbing, or edema, 2+ distal pulses.   Assessment/Plan: 19 yo with bilateral TOA - Continue IV antibiotics - Follow up CBC - pain management as needed  LOS: 3 days    Seanpaul Preece 01/16/2020, 7:53 AM

## 2020-01-16 NOTE — Progress Notes (Signed)
Dr. Jeanann Lewandowsky notified of patient complaining of increased abdominal pain with hematuria with several tiny blood clots with voiding a second time today.  Orders received.

## 2020-01-17 LAB — URINE CULTURE: Culture: NO GROWTH

## 2020-01-17 MED ORDER — METRONIDAZOLE 500 MG PO TABS: 500.0000 mg | ORAL_TABLET | Freq: Two times a day (BID) | ORAL | 0 refills | Status: DC

## 2020-01-17 MED ORDER — IBUPROFEN 600 MG PO TABS
600.0000 mg | ORAL_TABLET | Freq: Four times a day (QID) | ORAL | 1 refills | Status: DC | PRN
Start: 2020-01-17 — End: 2020-02-05

## 2020-01-17 MED ORDER — DOXYCYCLINE HYCLATE 100 MG PO TABS: 100.0000 mg | ORAL_TABLET | Freq: Two times a day (BID) | ORAL | 0 refills | Status: DC

## 2020-01-17 NOTE — Discharge Summary (Signed)
Physician Discharge Summary  Patient ID: Jennifer Merritt MRN: [id number] DOB/AGE: Aug 11, 2001 19 y.o.  Admit date: 01/13/2020 Discharge date: 01/17/2020  Admission Diagnoses:  Discharge Diagnoses:  Active Problems:   TOA (tubo-ovarian abscess)   Discharged Condition: fair  Hospital Course: Jennifer Merritt is an 19 y.o. female who started having lower abd pain over the weekend. Pain increased and she presented to [facility] ED for eval. Eval revealed bilateral TOA's or hydrosalpinges Pt was transfer to our service for treatment.  Denies Bladder dysfunction. Some problems with constipation.   Pt sexual active, in same sex relationship Denies H/O STD's Nulligravid  LMP 01/05/20  Menstrual History: Menarche age: 6 Patient's last menstrual period was 01/06/2020.  History reviewed. No pertinent past medical history.       Past Surgical History:  Procedure Laterality Date  . GASTRIC BYPASS    . tummy tuck           Family History  Problem Relation Age of Onset  . Hypertension Mother     Social History:  reports that she has never smoked. She has never used smokeless tobacco. She reports current drug use. Drug: Marijuana. She reports that she does not drink alcohol.  Allergies: No Known Allergies  No medications prior to admission.    Review of Systems  Constitutional: Positive for chills and fever.  Respiratory: Negative.   Cardiovascular: Negative.   Gastrointestinal: Positive for abdominal pain and constipation.  Genitourinary: Positive for pelvic pain.   Patient was admitted on 5/4 and was placed on IV cefotetan and doxycycline and metronidazole. She had slow improvement and defervesced. Her appetite was normal and she requested discharge with OP f/u.    Consults: None  Significant Diagnostic Studies: labs:  CBC    Component Value Date/Time   WBC 13.1 (H) 01/16/2020 0721   RBC 3.66 (L) 01/16/2020 0721   HGB 10.4 (L) 01/16/2020 0721   HCT  31.2 (L) 01/16/2020 0721   PLT 205 01/16/2020 0721   MCV 85.2 01/16/2020 0721   MCH 28.4 01/16/2020 0721   MCHC 33.3 01/16/2020 0721   RDW 12.7 01/16/2020 0721   LYMPHSABS 1.4 01/15/2020 0550   MONOABS 0.6 01/15/2020 0550   EOSABS 0.0 01/15/2020 0550   BASOSABS 0.0 01/15/2020 0550   , microbiology: blood culture: negative and negative GC and CT and radiology: CT scan: possible TOA [place]- hydrosalpinges Treatments: IV hydration and antibiotics: metronidazole and cefotetan and doxycycline  Discharge Exam: Blood pressure 131/64, pulse 84, temperature 99.1 F (37.3 C), temperature source Oral, resp. rate 16, height 5\' 1"  (1.549 m), weight 82.1 kg, last menstrual period 01/06/2020, SpO2 99 %. General appearance: alert, cooperative and no distress Resp: effort normal GI: abnormal findings:  mild tenderness in the periumbilical area and in the RLQ  Disposition: Discharge disposition: 01-Home or Self Care       Discharge Instructions    Discharge patient   Complete by: As directed    Discharge disposition: 01-Home or Self Care   Discharge patient date: 01/17/2020     Allergies as of 01/17/2020   No Known Allergies     Medication List    TAKE these medications   doxycycline 100 MG tablet Commonly known as: VIBRA-TABS Take 1 tablet (100 mg total) by mouth every 12 (twelve) hours.   ibuprofen 600 MG tablet Commonly known as: ADVIL Take 1 tablet (600 mg total) by mouth every 6 (six) hours as needed.   metroNIDAZOLE 500 MG tablet Commonly known as: FLAGYL Take 1  tablet (500 mg total) by mouth 2 (two) times daily.      Follow-up Information    [facility] Follow up in 2 week(s).   Specialty: Obstetrics and Gynecology Contact information: [address] [place] [address] [phone]          Signed: Scheryl Darter 01/17/2020, 10:09 AM

## 2020-01-17 NOTE — Progress Notes (Signed)
Discharge teaching complete with pt. Pt understood all information and did not have any questions. Medications discussed. Pt discharged home with family.

## 2020-01-17 NOTE — Progress Notes (Signed)
Patient discharged to home with support person in stable condition via personal vehicle.  Discharge instructions and AVS reviewed by Shanon Rosser with patient and support person.  All questions answered at this time.

## 2020-01-17 NOTE — Discharge Instructions (Signed)
Pelvic Inflammatory Disease  Pelvic inflammatory disease (PID) is caused by an infection in some or all of the female reproductive organs. The infection can be in the uterus, ovaries, fallopian tubes, or the surrounding tissues in the pelvis. PID can cause abdominal or pelvic pain that comes on suddenly (acute pelvic pain). PID is a serious infection because it can lead to lasting (chronic) pelvic pain or the inability to have children (infertility). What are the causes? This condition is most often caused by bacteria that is spread during sexual contact. It can also be caused by a bacterial infection of the vagina (bacterial vaginosis) that is not spread by sexual contact. This condition occurs when the infection is not treated and the bacteria travel upward from the vagina or cervix into the reproductive organs. Bacteria may also be introduced into the reproductive organs following:  The birth of a baby.  A miscarriage.  An abortion.  Major pelvic surgery.  The insertion of an intrauterine device (IUD).  A sexual assault. What increases the risk? You are more likely to develop this condition if you:  Are younger than 21 years of age.  Are sexually active at a young age.  Have a history of STI (sexually transmitted infection) or PID.  Do not regularly use barrier contraception methods, such as condoms.  Have multiple sexual partners.  Have sex with someone who has symptoms of an STI.  Use a vaginal douche.  Have recently had an IUD inserted. What are the signs or symptoms? Symptoms of this condition include:  Abdominal or pelvic pain.  Fever.  Chills.  Abnormal vaginal discharge.  Abnormal uterine bleeding.  Unusual pain shortly after the end of a menstrual period.  Painful urination.  Pain with sex.  Nausea and vomiting. How is this diagnosed? This condition is diagnosed based on a pelvic exam and medical history. A pelvic exam can reveal signs of  infection, inflammation, and discharge in the vagina and the surrounding tissues. It can also help to identify painful areas. You may also have tests, such as:  Lab tests, including a pregnancy test, blood tests, and a urine test.  Culture tests of the vagina and cervix to check for an STI.  Ultrasound.  A laparoscopic procedure to look inside the pelvis.  Examination of vaginal discharge under a microscope. How is this treated? This condition may be treated with:  Antibiotic medicines taken by mouth (orally). For more severe cases, antibiotics may be given through an IV at the hospital.  Surgery. This is rare. Surgery may be needed if other treatments do not help.  Efforts to stop the spread of the infection. Sexual partners may need to be treated if the infection is caused by an STI. It may take weeks until you are completely well. If you are diagnosed with PID, you should also be checked for HIV (human immunodeficiency virus). Your health care provider may test you for infection again 3 months after treatment. You should not have unprotected sex. Follow these instructions at home:  Take over-the-counter and prescription medicines only as told by your health care provider.  If you were prescribed an antibiotic medicine, take it as told by your health care provider. Do not stop using the antibiotic even if you start to feel better.  Do not have sex until treatment is completed or as told by your health care provider. If PID is confirmed, your recent sexual partners will need treatment, especially if you had unprotected sex.  Keep all   follow-up visits as told by your health care provider. This is important. Contact a health care provider if:  You have increased or abnormal vaginal discharge.  Your pain does not improve.  You vomit.  You have a fever.  You cannot tolerate your medicines.  Your partner has an STI.  You have pain when you urinate. Get help right away if:   You have increased abdominal or pelvic pain.  You have chills.  Your symptoms are not better in 72 hours with treatment. Summary  Pelvic inflammatory disease (PID) is caused by an infection in some or all of the female reproductive organs.  PID is a serious infection because it can lead to lasting (chronic) pelvic pain or the inability to have children (infertility).  This infection is usually treated with antibiotic medicines.  Do not have sex until treatment is completed or as told by your health care provider. This information is not intended to replace advice given to you by your health care provider. Make sure you discuss any questions you have with your health care provider. Document Revised: 05/17/2018 Document Reviewed: 05/22/2018 Elsevier Patient Education  2020 Elsevier Inc.  

## 2020-01-19 LAB — CULTURE, BLOOD (ROUTINE X 2)
Culture: NO GROWTH
Culture: NO GROWTH
Special Requests: ADEQUATE

## 2020-01-27 ENCOUNTER — Telehealth (INDEPENDENT_AMBULATORY_CARE_PROVIDER_SITE_OTHER): Payer: Medicaid Other

## 2020-01-27 DIAGNOSIS — N7093 Salpingitis and oophoritis, unspecified: Secondary | ICD-10-CM

## 2020-01-27 DIAGNOSIS — Z0189 Encounter for other specified special examinations: Secondary | ICD-10-CM

## 2020-01-27 NOTE — Telephone Encounter (Signed)
After hours comment as follows: "caller states she was inpatient, she was told to call someone if she gets nauseas, and she has been vomiting today, she does not have PCP." Per chart review, pt was seen in ED for tubo-ovarian abscess. Pt has follow up appt with our office on 02/05/20.   Pt called. States nausea has completely resolved, believes this was due to taking antibiotics on an empty stomach. Pt reports completing all antibiotics. Reports minimal pelvic pressure, no pain. Pt asks if there is anything she needs to do prior to her appt with our office, I explained pt may continue to monitor her pain and contact us with any new symptoms.

## 2020-02-03 ENCOUNTER — Encounter: Payer: Self-pay | Admitting: *Deleted

## 2020-02-05 ENCOUNTER — Ambulatory Visit (INDEPENDENT_AMBULATORY_CARE_PROVIDER_SITE_OTHER): Payer: Medicaid Other | Admitting: Family Medicine

## 2020-02-05 ENCOUNTER — Other Ambulatory Visit: Payer: Self-pay

## 2020-02-05 ENCOUNTER — Encounter: Payer: Self-pay | Admitting: Family Medicine

## 2020-02-05 VITALS — BP 119/61 | HR 72 | Ht 63.0 in | Wt 178.4 lb

## 2020-02-05 DIAGNOSIS — B354 Tinea corporis: Secondary | ICD-10-CM | POA: Diagnosis not present

## 2020-02-05 DIAGNOSIS — N946 Dysmenorrhea, unspecified: Secondary | ICD-10-CM | POA: Diagnosis not present

## 2020-02-05 DIAGNOSIS — N7093 Salpingitis and oophoritis, unspecified: Secondary | ICD-10-CM | POA: Diagnosis not present

## 2020-02-05 MED ORDER — NYSTATIN 100000 UNIT/GM EX CREA: 1.0000 "application " | TOPICAL_CREAM | Freq: Two times a day (BID) | CUTANEOUS | 1 refills | Status: DC

## 2020-02-05 NOTE — Progress Notes (Signed)
   Subjective:    Patient ID: Jennifer Merritt is a 19 y.o. female presenting with Follow-up  on 02/05/2020  HPI: Here for hospital f/u. Admitted with TOAs. Feels like pain is much improved. She has sex with same sex partners. Neg GC/Chlam. Has not had a pap smear. Reports significantly painful cycles which are regular. On Depo for 3 years, but off that now. Reports peeling skin in her groin after taking her antibiotics.  Review of Systems  Constitutional: Negative for chills and fever.  Respiratory: Negative for shortness of breath.   Cardiovascular: Negative for chest pain.  Gastrointestinal: Negative for abdominal pain, nausea and vomiting.  Genitourinary: Negative for dysuria.  Skin: Negative for rash.      Objective:    BP 119/61   Pulse 72   Ht 5\' 3"  (1.6 m)   Wt 178 lb 6.4 oz (80.9 kg)   LMP 01/06/2020   BMI 31.60 kg/m  Physical Exam Constitutional:      General: She is not in acute distress.    Appearance: She is well-developed.  HENT:     Head: Normocephalic and atraumatic.  Eyes:     General: No scleral icterus. Cardiovascular:     Rate and Rhythm: Normal rate.  Pulmonary:     Effort: Pulmonary effort is normal.  Abdominal:     Palpations: Abdomen is soft.  Musculoskeletal:     Cervical back: Neck supple.  Skin:    General: Skin is warm and dry.  Neurological:     Mental Status: She is alert and oriented to person, place, and time.         Assessment & Plan:   Problem List Items Addressed This Visit      Unprioritized   TOA (tubo-ovarian abscess) - Primary    Improved--will f/u u/s.      Relevant Orders   01/08/2020 PELVIC COMPLETE WITH TRANSVAGINAL   Dysmenorrhea    ? Endometriomas on us--will repeat and then discuss possible treatment options.       Other Visit Diagnoses    Tinea corporis       Relevant Medications   nystatin cream (MYCOSTATIN)      Total time: 30 minutes reviewing notes, history, hospital records, labs and imaging,  discussion of findings, need for f/u and next steps.  Return in about 4 weeks (around 03/04/2020) for in person, a CPE, needs U/S.  03/06/2020 02/06/2020 8:41 AM

## 2020-02-05 NOTE — Patient Instructions (Signed)
Pelvic Inflammatory Disease  Pelvic inflammatory disease (PID) is an infection in some or all of the female reproductive organs. PID can be in the womb (uterus), ovaries, fallopian tubes, or nearby tissues that are inside the lower belly area (pelvis). PID can lead to problems if it is not treated. What are the causes?  Germs (bacteria) that are spread during sex. This is the most common cause.  Germs in the vagina that are not spread during sex.  Germs that travel up from the vagina or cervix to the reproductive organs after: ? The birth of a baby. ? A miscarriage. ? An abortion. ? Pelvic surgery. ? Insertion of an intrauterine device (IUD). ? A sexual assault. What increases the risk?  Being younger than 21 years old.  Having sex at a young age.  Having a history of STI (sexually transmitted infection) or PID.  Not using barrier birth control, such as condoms.  Having a lot of sex partners.  Having sex with someone who has symptoms of an STI.  Using a douche.  Having an IUD put in place. What are the signs or symptoms?  Pain in the belly area.  Fever.  Chills.  Discharge from the vagina that is not normal.  Bleeding from the womb that is not normal.  Pain soon after the end of a menstrual period.  Pain when you pee (urinate).  Pain with sex.  Feeling sick to your stomach (nauseous) or throwing up (vomiting). How is this treated?  Antibiotic medicines. In very bad cases, these may be given through an IV tube.  Surgery. This is rare.  Efforts to stop the spread of the infection. Sex partners may need to be treated. It may take weeks until you feel all better. Your doctor may test you for infection again after you finish treatment. You should also be checked for HIV (human immunodeficiency virus). Follow these instructions at home:  Take over-the-counter and prescription medicines only as told by your doctor.  If you were prescribed an antibiotic  medicine, take it as told by your doctor. Do not stop taking it even if you start to feel better.  Do not have sex until treatment is done or as told by your doctor.  Tell your sex partner if you have PID. Your partner may need to be treated.  Keep all follow-up visits as told by your doctor. This is important. Contact a doctor if:  You have more fluid or fluid that is not normal coming from your vagina.  Your pain does not improve.  You throw up.  You have a fever.  You cannot take your medicines.  Your partner has an STI.  You have pain when you pee. Get help right away if:  You have more pain in the belly area.  You have chills.  You are not better in 72 hours with treatment. Summary  Pelvic inflammatory disease (PID) is caused by an infection in some or all of the female reproductive organs.  PID is a serious infection.  This infection is most often treated with antibiotics.  Do not have sex until treatment is done or as told by your doctor. This information is not intended to replace advice given to you by your health care provider. Make sure you discuss any questions you have with your health care provider. Document Revised: 05/17/2018 Document Reviewed: 05/23/2018 Elsevier Patient Education  2020 Elsevier Inc.  

## 2020-02-06 ENCOUNTER — Encounter: Payer: Self-pay | Admitting: Family Medicine

## 2020-02-06 DIAGNOSIS — N946 Dysmenorrhea, unspecified: Secondary | ICD-10-CM | POA: Insufficient documentation

## 2020-02-06 NOTE — Assessment & Plan Note (Signed)
Improved--will f/u u/s.

## 2020-02-06 NOTE — Assessment & Plan Note (Signed)
?   Endometriomas on us--will repeat and then discuss possible treatment options.

## 2020-02-17 ENCOUNTER — Other Ambulatory Visit: Payer: Self-pay | Admitting: Family Medicine

## 2020-02-17 ENCOUNTER — Other Ambulatory Visit: Payer: Self-pay

## 2020-02-17 ENCOUNTER — Ambulatory Visit
Admission: RE | Admit: 2020-02-17 | Discharge: 2020-02-17 | Disposition: A | Discharge status: Home / Self Care | Payer: Medicaid Other | Source: Ambulatory Visit | Attending: Family Medicine | Admitting: Family Medicine

## 2020-02-17 DIAGNOSIS — N7093 Salpingitis and oophoritis, unspecified: Secondary | ICD-10-CM

## 2020-02-23 ENCOUNTER — Inpatient Hospital Stay (HOSPITAL_COMMUNITY)
Admission: AD | Admit: 2020-02-23 | Discharge: 2020-02-23 | Disposition: A | Discharge status: Home / Self Care | Payer: Medicaid Other | Attending: Family Medicine | Admitting: Family Medicine

## 2020-02-23 ENCOUNTER — Other Ambulatory Visit: Payer: Self-pay

## 2020-02-23 DIAGNOSIS — R109 Unspecified abdominal pain: Secondary | ICD-10-CM | POA: Diagnosis present

## 2020-02-23 DIAGNOSIS — N83202 Unspecified ovarian cyst, left side: Secondary | ICD-10-CM | POA: Insufficient documentation

## 2020-02-23 DIAGNOSIS — N739 Female pelvic inflammatory disease, unspecified: Secondary | ICD-10-CM | POA: Insufficient documentation

## 2020-02-23 DIAGNOSIS — N920 Excessive and frequent menstruation with regular cycle: Secondary | ICD-10-CM | POA: Diagnosis not present

## 2020-02-23 DIAGNOSIS — N946 Dysmenorrhea, unspecified: Secondary | ICD-10-CM

## 2020-02-23 LAB — POCT PREGNANCY, URINE: Preg Test, Ur: NEGATIVE

## 2020-02-23 NOTE — Progress Notes (Signed)
Patient Jennifer Merritt is a 19 y.o. [id number]  at Unknown here with complaints of abdominal and heavier than normal period that started last night. She felt feverish today but has no fever now. She just wants to make sure everything is ok.  She was recently hospitalized with PID in early May; was treated with abx inpatient and went home on antibiotics. She finished her antibiotics and was feeling much better at her follow-up visit with [facility] on 5/26. Her [place] on 6/7 showed new cyst on the left; in addition to the hemorraghic cyst. She also reports increased bleeding; changing pad frequently. She has a history of heavy, painful periods.   Patient's UPT is negative in triage, explained to patient that she can be seen in the main ED if she feels her condition warrants emergency treatment, or she can increase frequency of pain medicine (has only taken ibuprofen 400 mg twice today). Recommended patient call [facility] and make follow up appt closer than July 1, as well as seek ED care if dizziness, lightheadedness, weakness or SOB.    Patient Vitals for the past 24 hrs:  BP Temp Temp src Pulse Resp SpO2  02/23/20 1846 115/61 98.4 F (36.9 C) Oral 65 16 100 %    Patient verbalized understanding; patient is stable for discharge.   Luna Kitchens

## 2020-02-23 NOTE — MAU Note (Signed)
Jennifer Merritt is a 19 y.o. here in MAU reporting: having back pain and pain in her side. Also having heavy bleeding, states going through pads within 20 minutes. States cycle started last night and that's when her pain came back.  LMP: reports she is on her cycle  Onset of complaint: last night  Pain score: 8/10  Vitals:   02/23/20 1846  BP: 115/61  Pulse: 65  Resp: 16  Temp: 98.4 F (36.9 C)  SpO2: 100%     Lab orders placed from triage: UPT

## 2020-03-12 ENCOUNTER — Other Ambulatory Visit: Payer: Self-pay

## 2020-03-12 ENCOUNTER — Other Ambulatory Visit (HOSPITAL_COMMUNITY)
Admission: RE | Admit: 2020-03-12 | Discharge: 2020-03-12 | Disposition: A | Discharge status: Home / Self Care | Payer: Medicaid Other | Source: Ambulatory Visit | Attending: Family Medicine | Admitting: Family Medicine

## 2020-03-12 ENCOUNTER — Ambulatory Visit (INDEPENDENT_AMBULATORY_CARE_PROVIDER_SITE_OTHER): Payer: Medicaid Other | Admitting: Family Medicine

## 2020-03-12 ENCOUNTER — Encounter: Payer: Self-pay | Admitting: Family Medicine

## 2020-03-12 VITALS — BP 100/57 | HR 75 | Wt 181.1 lb

## 2020-03-12 DIAGNOSIS — N946 Dysmenorrhea, unspecified: Secondary | ICD-10-CM

## 2020-03-12 DIAGNOSIS — F419 Anxiety disorder, unspecified: Secondary | ICD-10-CM | POA: Insufficient documentation

## 2020-03-12 DIAGNOSIS — Z124 Encounter for screening for malignant neoplasm of cervix: Secondary | ICD-10-CM

## 2020-03-12 DIAGNOSIS — F411 Generalized anxiety disorder: Secondary | ICD-10-CM

## 2020-03-12 MED ORDER — NORGESTIMATE-ETH ESTRADIOL 0.25-35 MG-MCG PO TABS: 1.0000 | ORAL_TABLET | Freq: Every day | ORAL | 5 refills | Status: DC

## 2020-03-12 NOTE — Assessment & Plan Note (Signed)
If has endometriosis, will need hormonal therapy. OC's skipping placebos most likely--will repeat iron studies in 2 months. If falling, may resume Depo. Consider repeat u/s in 3-6 months.

## 2020-03-12 NOTE — Assessment & Plan Note (Signed)
With depression, discussed mindfulness, resilience, yoga, therapy.

## 2020-03-12 NOTE — Patient Instructions (Signed)
[facility]  Department of Social [place] [address], [place], [place] [address] [phone]   or  [place]human[place]services/social-services **SNAP/EBT/ Other nutritional benefits  [place] DHHS-Public Health-W[facility] [address], [place], [place] [address] [phone]  or  https://[place].gov[place]human-services/health-department **WIC for  women who are pregnant and postpartum, infants and children up to 5 years old  [facility] [address], [place], [place] [address] [phone]   or   www.theblessedtable.org  **Food pantry  [facility] [address], [place], [place] [address] [phone]   or   https://[facility].godaddysites.com  **Emergency food and prepared meals  [facility] [employer] [facility] [facility] [address], [place], [place] [address] [phone]   or   www.cedargrovetop.us **Food pantry  [facility] [address], [place], [place] [address] [phone]   or   www.facebook.com/[facility]-116430931718202 **Food pantry  [facility] [address], [place], [place] [address] [phone] **Food pantry  [facility] [address], [place], [place] [address] [phone]   or   www.g[facility]borourbanministry.org  **Food pantry and prepared meals  [facility] [address], [place], [place] [address] https://jfsgreens[place].[email or web address]/  **Food pantry  [facility] [address],  [place], [place] [address] [phone]   or   www.lbcnow.org  **Food pantry  [facility] [address], [place], [place] [address] [phone]   or   http://www.onestepfurther.com **Food pantry, nutrition education, gardening activities  [facility] [address], [place], [place] [address] [phone] **Food pantry  [facility] [address], [place], [place] [address] [phone]   or   www.[facility]vation[facility].org **Food pantry  [facility] [place] [address], , [place] [address] [phone]   or   http://senior-resources[place].org **Meals on Wheels Program  [facility] [address], , [place] [address] [phone]   or   www.stmattchurch.com  **Food pantry  [facility] [facility] [address], , [place] [address] [phone]   or   [facility].org **Food pantry     

## 2020-03-12 NOTE — Progress Notes (Signed)
GAD-7 today is positive. Pt reports hx of depression. Not currently followed for this diagnosis. Jennifer Merritt services offered and pt denied.   Fleet Contras RN 03/12/20

## 2020-03-12 NOTE — Progress Notes (Signed)
° °  Subjective:    Patient ID: Jennifer Merritt is a 19 y.o. female presenting with Follow-up  on 03/12/2020  HPI: Here for f/u has cysts, which may be related to endometriosis. Hospitalized with low iron, thought to be related to OC use. Has been on depo in past, but did not like related to weight gain. Same sex partner. Has anxiety, did not like her Fluoxetine, or her therapist.   Review of Systems  Constitutional: Negative for chills and fever.  Respiratory: Negative for shortness of breath.   Cardiovascular: Negative for chest pain.  Gastrointestinal: Negative for abdominal pain, nausea and vomiting.  Genitourinary: Negative for dysuria.  Skin: Negative for rash.      Objective:    BP (!) 100/57    Pulse 75    Wt 181 lb 1.6 oz (82.1 kg)    LMP 02/22/2020 (LMP Unknown)    BMI 32.08 kg/m  Physical Exam Constitutional:      General: She is not in acute distress.    Appearance: She is well-developed.  HENT:     Head: Normocephalic and atraumatic.  Eyes:     General: No scleral icterus. Cardiovascular:     Rate and Rhythm: Normal rate.  Pulmonary:     Effort: Pulmonary effort is normal.  Abdominal:     Palpations: Abdomen is soft.  Musculoskeletal:     Cervical back: Neck supple.  Skin:    General: Skin is warm and dry.  Neurological:     Mental Status: She is alert and oriented to person, place, and time.         Assessment & Plan:   Problem List Items Addressed This Visit      Unprioritized   Dysmenorrhea - Primary    If has endometriosis, will need hormonal therapy. OC's skipping placebos most likely--will repeat iron studies in 2 months. If falling, may resume Depo. Consider repeat u/s in 3-6 months.      Relevant Medications   norgestimate-ethinyl estradiol (ORTHO-CYCLEN) 0.25-35 MG-MCG tablet   Anxiety disorder    With depression, discussed mindfulness, resilience, yoga, therapy.       Other Visit Diagnoses    Encounter for Papanicolaou smear for  cervical cancer screening       Relevant Orders   Cytology - PAP( [place])      Total time in review of prior notes, pathology, labs, history taking, review with patient, exam, note writing, discussion of options, plan for next steps, alternatives and risks of treatment: 30 minutes.  Return in about 8 weeks (around 05/07/2020) for in person w/ me.  Reva Bores 03/12/2020 2:53 PM

## 2020-03-17 LAB — CYTOLOGY - PAP
Adequacy: ABSENT
Diagnosis: NEGATIVE

## 2020-05-06 ENCOUNTER — Ambulatory Visit: Payer: Medicaid Other | Admitting: Family Medicine

## 2020-05-06 ENCOUNTER — Encounter: Payer: Self-pay | Admitting: Family Medicine

## 2020-05-06 NOTE — Progress Notes (Signed)
Patient did not keep appointment today. She may call to reschedule.  

## 2021-02-18 IMAGING — CT CT ABD-PELV W/ CM
2 of 4 series · 16 of 46 positions shown, 18 images · IV contrast (omnipaque)
Comparison: None.

CLINICAL DATA: Lower abdomen pain with fever history of gastric
bypass

EXAM:
CT ABDOMEN AND PELVIS WITH CONTRAST
TECHNIQUE: Multidetector CT imaging of the abdomen and pelvis was performed
using the standard protocol following bolus administration of
intravenous contrast.
CONTRAST:  100mL OMNIPAQUE IOHEXOL 300 MG/ML  SOLN

[Series 2: axial st · axial · 0.72mm/px · z∈[-147,+233]mm · 13 of 86 slices shown, 15 images]
[im 5/86  soft-tissue]
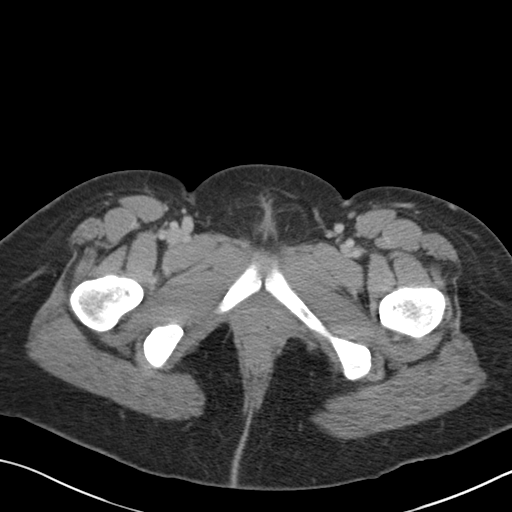
[im 5/86  bone]
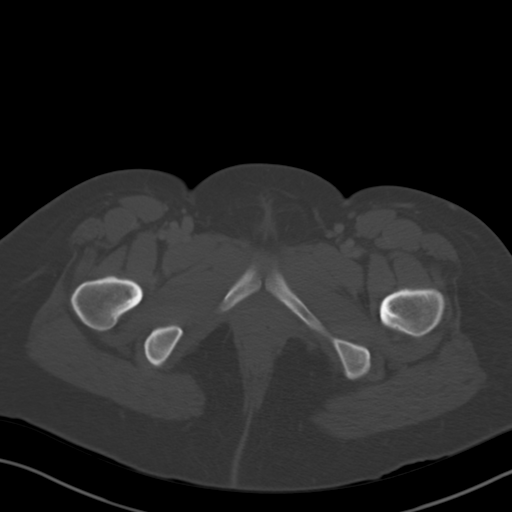
[im 14/86  soft-tissue]
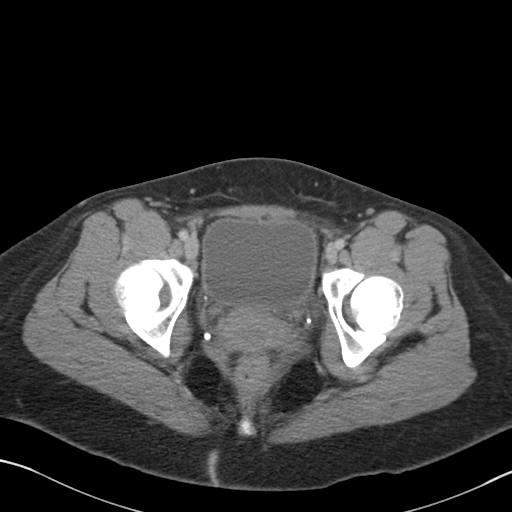
[im 18/86  soft-tissue]
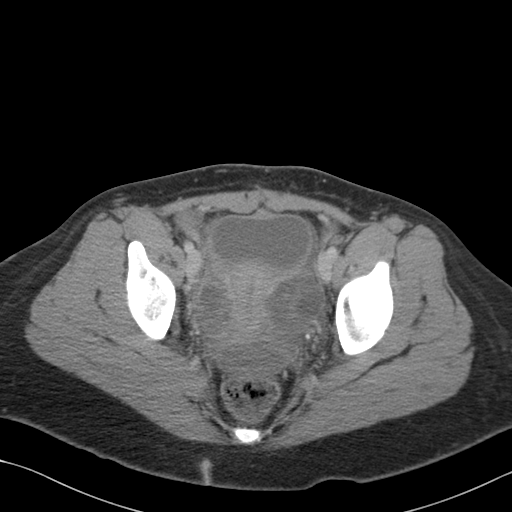
[im 23/86  soft-tissue]
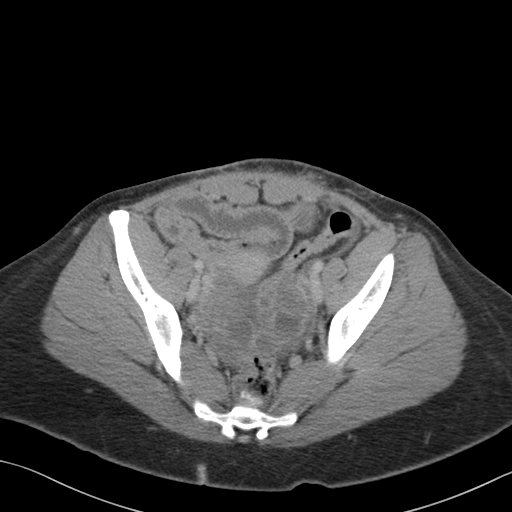
[im 32/86  soft-tissue]
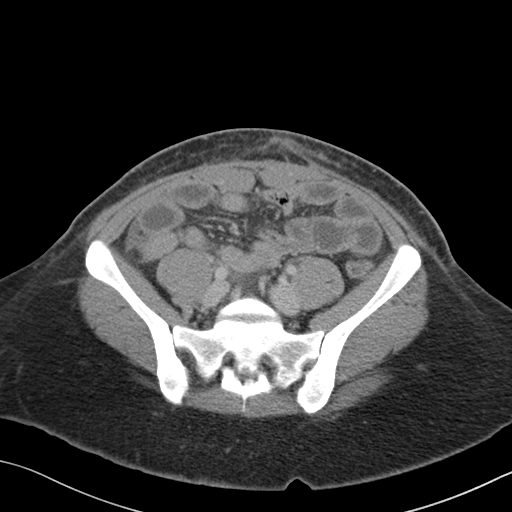
[im 36/86  soft-tissue]
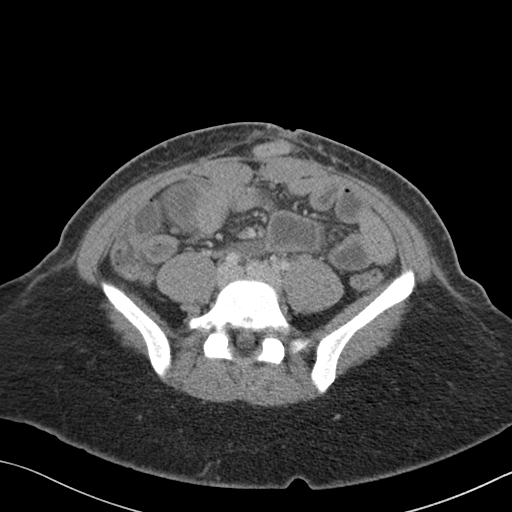
[im 45/86  soft-tissue]
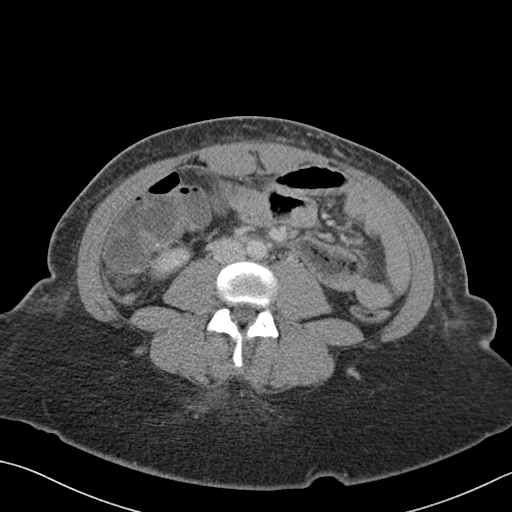
[im 50/86  soft-tissue]
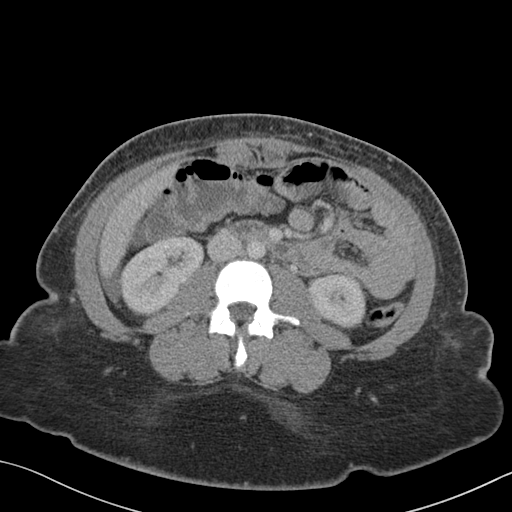
[im 54/86  soft-tissue]
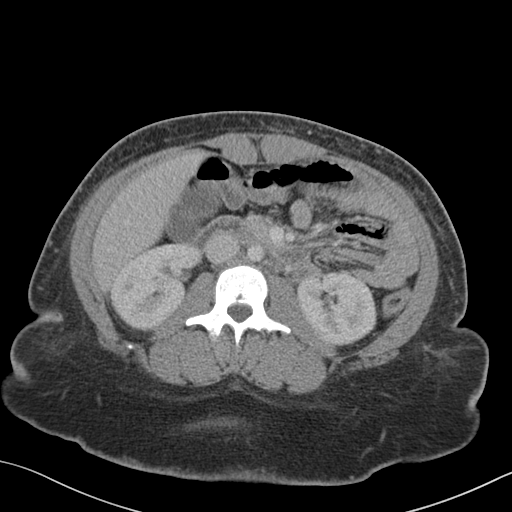
[im 54/86  bone]
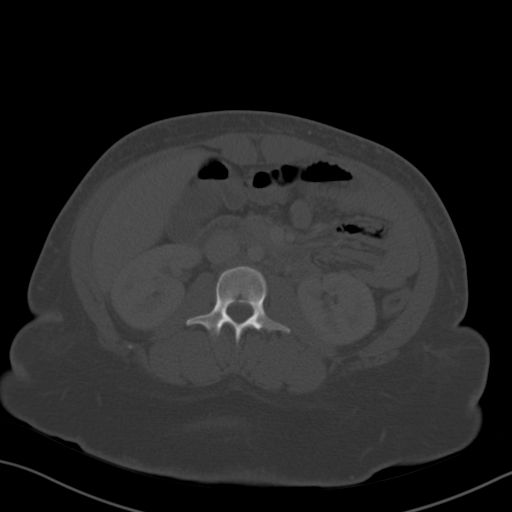
[im 63/86  soft-tissue]
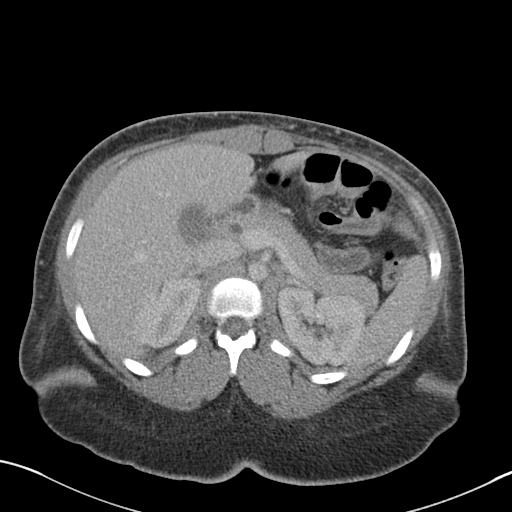
[im 68/86  soft-tissue]
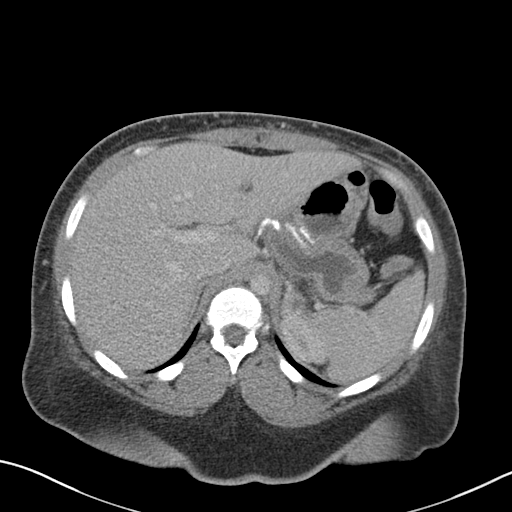
[im 72/86  soft-tissue]
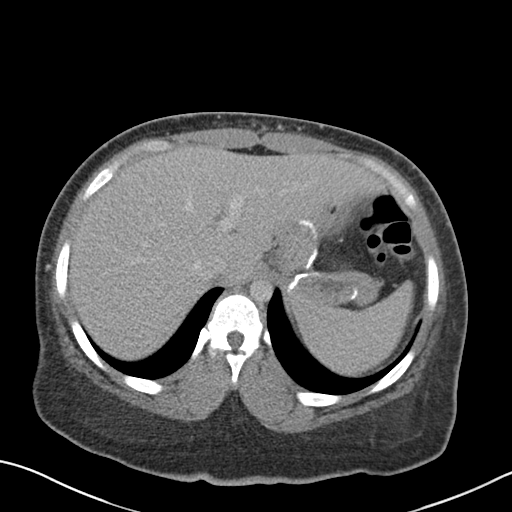
[im 81/86  soft-tissue]
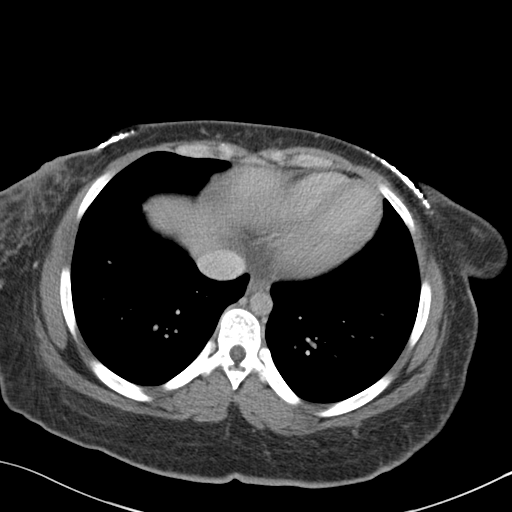

[Series 4: coronal st · coronal · 0.68mm/px · 3 of 151 slices shown]
[im 51/151  soft-tissue]
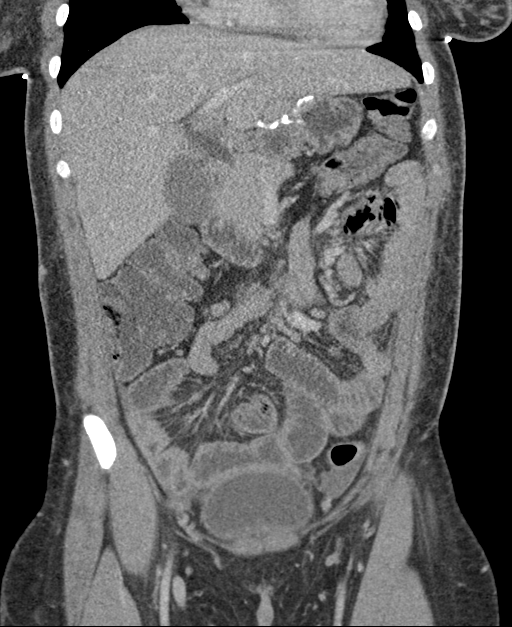
[im 67/151  soft-tissue]
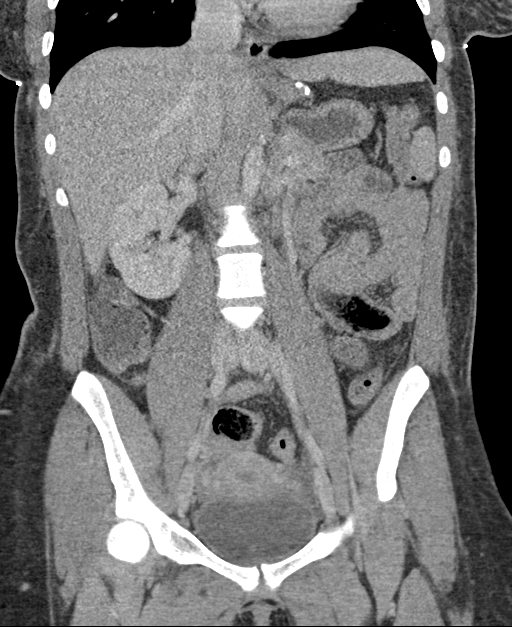
[im 84/151  soft-tissue]
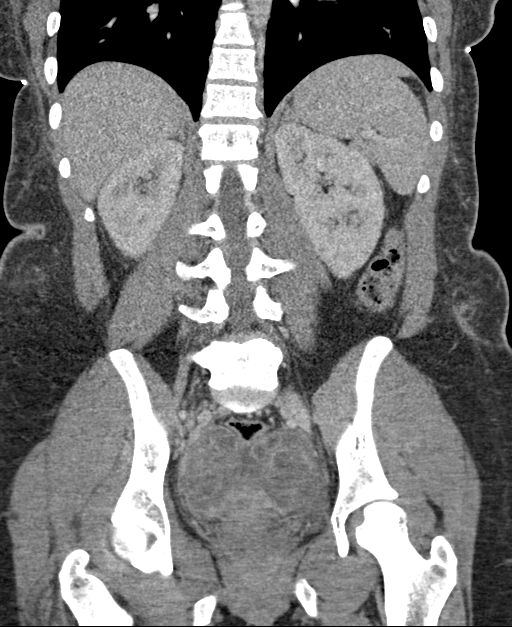

[16 of 46 positions shown; findings below may reference images not displayed]

FINDINGS: Lower chest: Lung bases demonstrate no acute consolidation or
pleural effusion. A punctate focus of gas within the right anterior
chest wall appears external to the pleural space and peritoneal
cavity.

Hepatobiliary: No focal liver abnormality is seen. No gallstones,
gallbladder wall thickening, or biliary dilatation.

Pancreas: Unremarkable. No pancreatic ductal dilatation or
surrounding inflammatory changes.

Spleen: Normal in size without focal abnormality.

Adrenals/Urinary Tract: Adrenal glands are unremarkable. Kidneys are
normal, without renal calculi, focal lesion, or hydronephrosis.
Bladder is unremarkable.

Stomach/Bowel: Status post gastric bypass. Appendix appears normal.
No evidence of bowel wall thickening, distention, or inflammatory
changes.

Vascular/Lymphatic: No significant vascular findings are present. No
enlarged abdominal or pelvic lymph nodes.

Reproductive: Uterus unremarkable. Multiple rim enhancing lesions
within the bilateral adnexa. On the left this measures approximately
5.8 cm and on the right this measures approximately 6.5 cm.

Other: Small free fluid in the pelvis and adjacent to the liver. No
free air. Thickening of the umbilicus.

Musculoskeletal: No acute or suspicious osseous abnormality.
IMPRESSION: 1. Multiple bilateral rim enhancing lesions within the
pelvis/bilateral adnexa, indeterminate for complex bilateral ovarian
cysts versus tubo-ovarian abscess.
2. Small free fluid within the pelvis and right upper quadrant

## 2022-03-29 ENCOUNTER — Emergency Department (HOSPITAL_COMMUNITY)
Admission: EM | Admit: 2022-03-29 | Discharge: 2022-03-30 | Disposition: A | Discharge status: Home / Self Care | Payer: Medicaid Other | Attending: Emergency Medicine | Admitting: Emergency Medicine

## 2022-03-29 ENCOUNTER — Encounter (HOSPITAL_COMMUNITY): Payer: Self-pay

## 2022-03-29 ENCOUNTER — Other Ambulatory Visit: Payer: Self-pay

## 2022-03-29 DIAGNOSIS — N9489 Other specified conditions associated with female genital organs and menstrual cycle: Secondary | ICD-10-CM | POA: Insufficient documentation

## 2022-03-29 DIAGNOSIS — R102 Pelvic and perineal pain: Secondary | ICD-10-CM | POA: Insufficient documentation

## 2022-03-29 DIAGNOSIS — N390 Urinary tract infection, site not specified: Secondary | ICD-10-CM | POA: Insufficient documentation

## 2022-03-29 DIAGNOSIS — D649 Anemia, unspecified: Secondary | ICD-10-CM | POA: Insufficient documentation

## 2022-03-29 LAB — URINALYSIS, MICROSCOPIC (REFLEX)

## 2022-03-29 LAB — I-STAT BETA HCG BLOOD, ED (MC, WL, AP ONLY): I-stat hCG, quantitative: <5 m[IU]/mL (ref <5)

## 2022-03-29 LAB — URINALYSIS, ROUTINE W REFLEX MICROSCOPIC
Bilirubin Urine: NEGATIVE
Glucose, UA: NEGATIVE mg/dL
Ketones, ur: 15 mg/dL — AB
Nitrite: POSITIVE — AB
Specific Gravity, Urine: >1.03 — ABNORMAL HIGH (ref 1.005–1.030)
pH: 6 (ref 5.0–8.0)

## 2022-03-29 NOTE — ED Triage Notes (Signed)
Patient stated she is having problems urinating. "Urine is trickling out, feeling like she has to pee" but not emptying her bladder, back and pelvic area are "killing her." History of ovarian cysts and she said it feels like this right now. Has been going on for 2 days.

## 2022-03-30 LAB — CBC
HCT: 36.7 % (ref 36.0–46.0)
Hemoglobin: 11.4 g/dL — ABNORMAL LOW (ref 12.0–15.0)
MCH: 25.7 pg — ABNORMAL LOW (ref 26.0–34.0)
MCHC: 31.1 g/dL (ref 30.0–36.0)
MCV: 82.8 fL (ref 80.0–100.0)
Platelets: 276 10*3/uL (ref 150–400)
RBC: 4.43 MIL/uL (ref 3.87–5.11)
RDW: 14.1 % (ref 11.5–15.5)
WBC: 6 10*3/uL (ref 4.0–10.5)
nRBC: 0 % (ref 0.0–0.2)

## 2022-03-30 LAB — BASIC METABOLIC PANEL
Anion gap: 6 (ref 5–15)
BUN: 11 mg/dL (ref 6–20)
CO2: 25 mmol/L (ref 22–32)
Calcium: 8.7 mg/dL — ABNORMAL LOW (ref 8.9–10.3)
Chloride: 108 mmol/L (ref 98–111)
Creatinine, Ser: 0.72 mg/dL (ref 0.44–1.00)
GFR, Estimated: >60 mL/min (ref >60)
Glucose, Bld: 94 mg/dL (ref 70–99)
Potassium: 3.6 mmol/L (ref 3.5–5.1)
Sodium: 139 mmol/L (ref 135–145)

## 2022-03-30 MED ORDER — CEPHALEXIN 500 MG PO CAPS: 500.0000 mg | ORAL_CAPSULE | Freq: Three times a day (TID) | ORAL | 0 refills | Status: DC

## 2022-03-30 MED ORDER — CEPHALEXIN 500 MG PO CAPS
500.0000 mg | ORAL_CAPSULE | Freq: Once | ORAL | Status: AC
  Administered 2022-03-30: 500 mg via ORAL
  Filled 2022-03-30: qty 1

## 2022-03-30 NOTE — Discharge Instructions (Signed)
Drink plenty of fluids.  You may take ibuprofen or acetaminophen as needed for pain.  For better pain relief, you may take both ibuprofen and acetaminophen.  Return if you start running a fever, start vomiting.  If symptoms or not improving in 2-3 days, you need to be reevaluated.

## 2022-03-30 NOTE — ED Provider Notes (Signed)
[facility]-EMERGENCY DEPT Provider Note   CSN: [id number] Arrival date & time: 03/29/22  2313     History  Chief Complaint  Patient presents with   Pelvic Pain    Monteen Toops is a 21 y.o. female.  The history is provided by the patient.  Pelvic Pain  She complains of pelvic pain and urinary urgency and frequency and tenesmus over the last 3 days.  She denies fever, chills, sweats.  She denies nausea or vomiting.  She denies any vaginal discharge.  She has not been taking anything for her symptoms.   Home Medications Prior to Admission medications   Medication Sig Start Date End Date Taking? Authorizing Provider  norgestimate-ethinyl estradiol (ORTHO-CYCLEN) 0.25-35 MG-MCG tablet Take 1 tablet by mouth daily. Skip placebos 03/12/20   Reva Bores, MD      Allergies    Patient has no known allergies.    Review of Systems   Review of Systems  Genitourinary:  Positive for pelvic pain.  All other systems reviewed and are negative.   Physical Exam Updated Vital Signs BP (!) 148/70   Pulse 75   Temp 98.3 F (36.8 C) (Oral)   Resp 19   Ht 5\' 3"  (1.6 m)   Wt 80.7 kg   SpO2 100%   BMI 31.53 kg/m  Physical Exam Vitals and nursing note reviewed.   21 year old female, resting comfortably and in no acute distress. Vital signs are significant for mildly elevated blood pressure. Oxygen saturation is 100%, which is normal. Head is normocephalic and atraumatic. PERRLA, EOMI. Oropharynx is clear. Neck is nontender and supple without adenopathy or JVD. Back is nontender and there is no CVA tenderness. Lungs are clear without rales, wheezes, or rhonchi. Chest is nontender. Heart has regular rate and rhythm without murmur. Abdomen is soft, flat, with mild suprapubic tenderness.  There is no rebound or guarding.  Peristalsis is normoactive. Extremities have no cyanosis or edema, full range of motion is present. Skin is warm and dry without  rash. Neurologic: Mental status is normal, cranial nerves are intact, moves all extremities equally.  ED Results / Procedures / Treatments   Labs (all labs ordered are listed, but only abnormal results are displayed) Labs Reviewed  URINALYSIS, ROUTINE W REFLEX MICROSCOPIC - Abnormal; Notable for the following components:      Result Value   APPearance HAZY (*)    Specific Gravity, Urine >1.030 (*)    Hgb urine dipstick TRACE (*)    Ketones, ur 15 (*)    Protein, ur TRACE (*)    Nitrite POSITIVE (*)    Leukocytes,Ua SMALL (*)    All other components within normal limits  BASIC METABOLIC PANEL - Abnormal; Notable for the following components:   Calcium 8.7 (*)    All other components within normal limits  CBC - Abnormal; Notable for the following components:   Hemoglobin 11.4 (*)    MCH 25.7 (*)    All other components within normal limits  URINALYSIS, MICROSCOPIC (REFLEX) - Abnormal; Notable for the following components:   Bacteria, UA RARE (*)    All other components within normal limits  I-STAT BETA HCG BLOOD, ED ([facility], [facility], [facility] ONLY)   Procedures Procedures    Medications Ordered in ED Medications - No data to display  ED Course/ Medical Decision Making/ A&P  Medical Decision Making Amount and/or Complexity of Data Reviewed Labs: ordered.   Pelvic pain with urinary urgency and frequency.  Probable UTI, consider ovarian cyst, pelvic inflammatory disease.  I have reviewed and interpreted her laboratory tests, my interpretation is normal WBC, mild anemia which is improved compared with baseline, normal metabolic panel.  Urinalysis is consistent with urinary tract infection with positive nitrite, also mild ketonuria and concentrated specimen suggesting she needs to increase her urine output.  Pregnancy test is negative.  At this point, I told patient she clearly has a urinary tract infection, but I cannot exclude concomitant PID.  I offered option of  pelvic exam with STI testing today versus starting her on antibiotics and proceeding with further work-up if she does not improve clinically.  Patient is decided to proceed with antibiotics.  I have ordered a dose of cephalexin and prescription for cephalexin.  She is referred to the women's outpatient clinic for follow-up if symptoms or not improving in 2-3 days.  Final Clinical Impression(s) / ED Diagnoses Final diagnoses:  Urinary tract infection without hematuria, site unspecified  Pelvic pain in female    Rx / DC Orders ED Discharge Orders          Ordered    cephALEXin (KEFLEX) 500 MG capsule  3 times daily        03/30/22 0100              Dione Booze, MD 03/30/22 [phone]

## 2022-06-08 ENCOUNTER — Encounter (HOSPITAL_COMMUNITY): Payer: Self-pay

## 2022-06-08 ENCOUNTER — Ambulatory Visit (HOSPITAL_COMMUNITY)
Admission: EM | Admit: 2022-06-08 | Discharge: 2022-06-08 | Disposition: A | Discharge status: Home / Self Care | Payer: Self-pay | Attending: Internal Medicine | Admitting: Internal Medicine

## 2022-06-08 DIAGNOSIS — R1030 Lower abdominal pain, unspecified: Secondary | ICD-10-CM | POA: Insufficient documentation

## 2022-06-08 LAB — CBC WITH DIFFERENTIAL/PLATELET
Abs Immature Granulocytes: 0.01 10*3/uL (ref 0.00–0.07)
Basophils Absolute: 0 10*3/uL (ref 0.0–0.1)
Basophils Relative: 0 %
Eosinophils Absolute: 0.1 10*3/uL (ref 0.0–0.5)
Eosinophils Relative: 1 %
HCT: 37.5 % (ref 36.0–46.0)
Hemoglobin: 11.9 g/dL — ABNORMAL LOW (ref 12.0–15.0)
Immature Granulocytes: 0 %
Lymphocytes Relative: 45 %
Lymphs Abs: 2.2 10*3/uL (ref 0.7–4.0)
MCH: 24.9 pg — ABNORMAL LOW (ref 26.0–34.0)
MCHC: 31.7 g/dL (ref 30.0–36.0)
MCV: 78.6 fL — ABNORMAL LOW (ref 80.0–100.0)
Monocytes Absolute: 0.7 10*3/uL (ref 0.1–1.0)
Monocytes Relative: 14 %
Neutro Abs: 2 10*3/uL (ref 1.7–7.7)
Neutrophils Relative %: 40 %
Platelets: 294 10*3/uL (ref 150–400)
RBC: 4.77 MIL/uL (ref 3.87–5.11)
RDW: 14.7 % (ref 11.5–15.5)
WBC: 5 10*3/uL (ref 4.0–10.5)
nRBC: 0 % (ref 0.0–0.2)

## 2022-06-08 LAB — POCT URINALYSIS DIPSTICK, ED / UC
Glucose, UA: NEGATIVE mg/dL
Hgb urine dipstick: NEGATIVE
Leukocytes,Ua: NEGATIVE
Nitrite: NEGATIVE
Protein, ur: NEGATIVE mg/dL
Specific Gravity, Urine: >=1.03 (ref 1.005–1.030)
Urobilinogen, UA: 0.2 mg/dL (ref 0.0–1.0)
pH: 5.5 (ref 5.0–8.0)

## 2022-06-08 LAB — POC URINE PREG, ED: Preg Test, Ur: NEGATIVE

## 2022-06-08 LAB — COMPREHENSIVE METABOLIC PANEL
ALT: 28 U/L (ref 0–44)
AST: 31 U/L (ref 15–41)
Albumin: 3.7 g/dL (ref 3.5–5.0)
Alkaline Phosphatase: 53 U/L (ref 38–126)
Anion gap: 9 (ref 5–15)
BUN: 9 mg/dL (ref 6–20)
CO2: 24 mmol/L (ref 22–32)
Calcium: 9.2 mg/dL (ref 8.9–10.3)
Chloride: 105 mmol/L (ref 98–111)
Creatinine, Ser: 0.65 mg/dL (ref 0.44–1.00)
GFR, Estimated: >60 mL/min (ref >60)
Glucose, Bld: 80 mg/dL (ref 70–99)
Potassium: 3.9 mmol/L (ref 3.5–5.1)
Sodium: 138 mmol/L (ref 135–145)
Total Bilirubin: 0.6 mg/dL (ref 0.3–1.2)
Total Protein: 7.6 g/dL (ref 6.5–8.1)

## 2022-06-08 LAB — LIPASE, BLOOD: Lipase: 49 U/L (ref 11–51)

## 2022-06-08 NOTE — ED Triage Notes (Addendum)
Pt c/o lower abdominal pain, n/v/d, unable to eat, and distended x5 days. 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 Pt states saw dark red blood in her urine yesterday.

## 2022-06-08 NOTE — Discharge Instructions (Addendum)
Advised patient we will follow-up with urine culture and lab results once returned.  Encouraged patient to increase daily water intake to 64 ounces per day. Advised  if abdominal pain worsens and/or unresolved please go to nearest ED for further evaluation.

## 2022-06-08 NOTE — ED Provider Notes (Signed)
[facility]    CSN: [id number] Arrival date & time: 06/08/22  1621      History   Chief Complaint Chief Complaint  Patient presents with   Abdominal Pain   Emesis    HPI Jennifer Merritt is a 21 y.o. female.   HPI 21 year old female presents with abdominal pain for 2 days.  PMH significant for obesity, anxiety, dysmenorrhea, and history of tubo-ovarian abscess.  Patient reports history of gastric bypass and problems with stomach pain for several years now.  She is currently not followed by PCP.  History reviewed. No pertinent past medical history.  Patient Active Problem List   Diagnosis Date Noted   Anxiety disorder 03/12/2020   Dysmenorrhea 02/06/2020   TOA (tubo-ovarian abscess) 01/13/2020    Past Surgical History:  Procedure Laterality Date   GASTRIC BYPASS     tummy tuck      OB History     Gravida  0   Para  0   Term  0   Preterm  0   AB  0   Living  0      SAB  0   IAB  0   Ectopic  0   Multiple  0   Live Births  0            Home Medications    Prior to Admission medications   Not on File    Family History Family History  Problem Relation Age of Onset   Hypertension Mother     Social History Social History   Tobacco Use   Smoking status: Never   Smokeless tobacco: Never  Vaping Use   Vaping Use: Never used  Substance Use Topics   Alcohol use: Never   Drug use: Yes    Types: Marijuana     Allergies   Patient has no known allergies.   Review of Systems Review of Systems  Gastrointestinal:  Positive for abdominal pain.  All other systems reviewed and are negative.    Physical Exam Triage Vital Signs ED Triage Vitals  Enc Vitals Group     BP 06/08/22 1721 123/73     Pulse Rate 06/08/22 1721 62     Resp 06/08/22 1721 18     Temp 06/08/22 1721 98.5 F (36.9 C)     Temp Source 06/08/22 1721 Oral     SpO2 06/08/22 1721 96 %     Weight --      Height --      Head Circumference --       Peak Flow --      Pain Score 06/08/22 1722 8     Pain Loc --      Pain Edu? --      Excl. in GC? --    No data found.  Updated Vital Signs BP 123/73 (BP Location: Left Arm)   Pulse 62   Temp 98.5 F (36.9 C) (Oral)   Resp 18   LMP 05/22/2022   SpO2 96%    Physical Exam Vitals and nursing note reviewed.  Constitutional:      General: She is not in acute distress.    Appearance: She is well-developed. She is obese. She is not ill-appearing, toxic-appearing or diaphoretic.  HENT:     Head: Normocephalic and atraumatic.     Mouth/Throat:     Mouth: Mucous membranes are moist.     Pharynx: Oropharynx is clear.  Eyes:     Extraocular Movements: Extraocular  movements intact.     Pupils: Pupils are equal, round, and reactive to light.  Cardiovascular:     Rate and Rhythm: Normal rate and regular rhythm.     Heart sounds: Normal heart sounds. No murmur heard. Pulmonary:     Effort: Pulmonary effort is normal.     Breath sounds: Normal breath sounds. No wheezing, rhonchi or rales.  Abdominal:     General: Abdomen is flat. Bowel sounds are decreased. There is no distension or abdominal bruit. There are no signs of injury.     Palpations: Abdomen is soft. There is no shifting dullness, fluid wave, hepatomegaly, splenomegaly, mass or pulsatile mass.     Tenderness: There is abdominal tenderness in the right lower quadrant and left lower quadrant. There is no right CVA tenderness, left CVA tenderness, guarding or rebound. Negative signs include Murphy's sign, Rovsing's sign and McBurney's sign.     Hernia: No hernia is present.  Skin:    General: Skin is warm and dry.  Neurological:     General: No focal deficit present.     Mental Status: She is alert and oriented to person, place, and time.      UC Treatments / Results  Labs (all labs ordered are listed, but only abnormal results are displayed) Labs Reviewed  POCT URINALYSIS DIPSTICK, ED / UC - Abnormal; Notable for the  following components:      Result Value   Bilirubin Urine SMALL (*)    Ketones, ur TRACE (*)    All other components within normal limits  URINE CULTURE  CBC WITH DIFFERENTIAL/PLATELET  COMPREHENSIVE METABOLIC PANEL  LIPASE, BLOOD  POC URINE PREG, ED    EKG   Radiology No results found.  Procedures Procedures (including critical care time)  Medications Ordered in UC Medications - No data to display  Initial Impression / Assessment and Plan / UC Course  I have reviewed the triage vital signs and the nursing notes.  Pertinent labs & imaging results that were available during my care of the patient were reviewed by me and considered in my medical decision making (see chart for details).     MDM: 1. Lower abdominal pain-CBC with differential, CMP, lipase ordered. Advised patient we will follow-up with lab results once returned.  If abdominal pain worsens and/or unresolved please go to nearest ED for further evaluation.  Patient discharged home, hemodynamically stable. Final Clinical Impressions(s) / UC Diagnoses   Final diagnoses:  Lower abdominal pain     Discharge Instructions      Advised patient we will follow-up with urine culture and lab results once returned.  Encouraged patient to increase daily water intake to 64 ounces per day. Advised  if abdominal pain worsens and/or unresolved please go to nearest ED for further evaluation.     ED Prescriptions   None    PDMP not reviewed this encounter.   Eliezer Lofts, [place] 06/08/22 1759

## 2022-06-09 ENCOUNTER — Emergency Department (HOSPITAL_COMMUNITY)
Admission: EM | Admit: 2022-06-09 | Discharge: 2022-06-09 | Discharge status: Against Medical Advice | Payer: Self-pay | Attending: Emergency Medicine | Admitting: Emergency Medicine

## 2022-06-09 ENCOUNTER — Encounter (HOSPITAL_COMMUNITY): Payer: Self-pay

## 2022-06-09 ENCOUNTER — Other Ambulatory Visit: Payer: Self-pay

## 2022-06-09 ENCOUNTER — Emergency Department (HOSPITAL_COMMUNITY)
Admission: EM | Admit: 2022-06-09 | Discharge: 2022-06-09 | Disposition: A | Discharge status: Home / Self Care | Payer: Self-pay | Attending: Emergency Medicine | Admitting: Emergency Medicine

## 2022-06-09 ENCOUNTER — Other Ambulatory Visit (HOSPITAL_COMMUNITY): Payer: Self-pay

## 2022-06-09 ENCOUNTER — Emergency Department (HOSPITAL_COMMUNITY): Payer: Self-pay

## 2022-06-09 DIAGNOSIS — R112 Nausea with vomiting, unspecified: Secondary | ICD-10-CM | POA: Insufficient documentation

## 2022-06-09 DIAGNOSIS — R319 Hematuria, unspecified: Secondary | ICD-10-CM | POA: Insufficient documentation

## 2022-06-09 DIAGNOSIS — R1030 Lower abdominal pain, unspecified: Secondary | ICD-10-CM | POA: Insufficient documentation

## 2022-06-09 DIAGNOSIS — R197 Diarrhea, unspecified: Secondary | ICD-10-CM | POA: Insufficient documentation

## 2022-06-09 DIAGNOSIS — Z5321 Procedure and treatment not carried out due to patient leaving prior to being seen by health care provider: Secondary | ICD-10-CM | POA: Insufficient documentation

## 2022-06-09 DIAGNOSIS — N9489 Other specified conditions associated with female genital organs and menstrual cycle: Secondary | ICD-10-CM | POA: Insufficient documentation

## 2022-06-09 LAB — COMPREHENSIVE METABOLIC PANEL
ALT: 26 U/L (ref 0–44)
AST: 27 U/L (ref 15–41)
Albumin: 3.8 g/dL (ref 3.5–5.0)
Alkaline Phosphatase: 51 U/L (ref 38–126)
Anion gap: 7 (ref 5–15)
BUN: 10 mg/dL (ref 6–20)
CO2: 20 mmol/L — ABNORMAL LOW (ref 22–32)
Calcium: 9 mg/dL (ref 8.9–10.3)
Chloride: 109 mmol/L (ref 98–111)
Creatinine, Ser: 0.57 mg/dL (ref 0.44–1.00)
GFR, Estimated: >60 mL/min (ref >60)
Glucose, Bld: 97 mg/dL (ref 70–99)
Potassium: 3.5 mmol/L (ref 3.5–5.1)
Sodium: 136 mmol/L (ref 135–145)
Total Bilirubin: 0.8 mg/dL (ref 0.3–1.2)
Total Protein: 7.8 g/dL (ref 6.5–8.1)

## 2022-06-09 LAB — URINALYSIS, ROUTINE W REFLEX MICROSCOPIC
Bilirubin Urine: NEGATIVE
Glucose, UA: NEGATIVE mg/dL
Hgb urine dipstick: NEGATIVE
Ketones, ur: 5 mg/dL — AB
Leukocytes,Ua: NEGATIVE
Nitrite: NEGATIVE
Protein, ur: 30 mg/dL — AB
Specific Gravity, Urine: 1.031 — ABNORMAL HIGH (ref 1.005–1.030)
pH: 5 (ref 5.0–8.0)

## 2022-06-09 LAB — CBC
HCT: 39.1 % (ref 36.0–46.0)
Hemoglobin: 12.5 g/dL (ref 12.0–15.0)
MCH: 25.4 pg — ABNORMAL LOW (ref 26.0–34.0)
MCHC: 32 g/dL (ref 30.0–36.0)
MCV: 79.5 fL — ABNORMAL LOW (ref 80.0–100.0)
Platelets: 308 10*3/uL (ref 150–400)
RBC: 4.92 MIL/uL (ref 3.87–5.11)
RDW: 14.9 % (ref 11.5–15.5)
WBC: 4 10*3/uL (ref 4.0–10.5)
nRBC: 0 % (ref 0.0–0.2)

## 2022-06-09 LAB — URINE CULTURE: Culture: <10000 — AB

## 2022-06-09 LAB — LIPASE, BLOOD: Lipase: 41 U/L (ref 11–51)

## 2022-06-09 LAB — I-STAT BETA HCG BLOOD, ED (MC, WL, AP ONLY): I-stat hCG, quantitative: <5 m[IU]/mL (ref <5)

## 2022-06-09 LAB — WET PREP, GENITAL
Sperm: NONE SEEN
Trich, Wet Prep: NONE SEEN
WBC, Wet Prep HPF POC: <10 (ref <10)
Yeast Wet Prep HPF POC: NONE SEEN

## 2022-06-09 MED ORDER — SODIUM CHLORIDE 0.9 % IV BOLUS
1000.0000 mL | Freq: Once | INTRAVENOUS | Status: AC
  Administered 2022-06-09: 1000 mL via INTRAVENOUS

## 2022-06-09 MED ORDER — ONDANSETRON 4 MG PO TBDP
4.0000 mg | ORAL_TABLET | Freq: Once | ORAL | Status: AC | PRN
  Administered 2022-06-09: 4 mg via ORAL
  Filled 2022-06-09: qty 1

## 2022-06-09 MED ORDER — ONDANSETRON HCL 4 MG PO TABS: 4.0000 mg | ORAL_TABLET | Freq: Four times a day (QID) | ORAL | 0 refills | Status: AC

## 2022-06-09 MED ORDER — IOHEXOL 300 MG/ML  SOLN
100.0000 mL | Freq: Once | INTRAMUSCULAR | Status: AC | PRN
  Administered 2022-06-09: 100 mL via INTRAVENOUS

## 2022-06-09 MED ORDER — SODIUM CHLORIDE (PF) 0.9 % IJ SOLN
INTRAMUSCULAR | Status: AC
  Filled 2022-06-09: qty 50

## 2022-06-09 MED ORDER — KETOROLAC TROMETHAMINE 15 MG/ML IJ SOLN
15.0000 mg | Freq: Once | INTRAMUSCULAR | Status: AC
  Administered 2022-06-09: 15 mg via INTRAVENOUS
  Filled 2022-06-09: qty 1

## 2022-06-09 NOTE — Discharge Instructions (Addendum)
Please take Zofran for nausea, OTC medication such as loperamide for diarrhea. Take tylenol/ibuprofen for pain. I recommend close follow-up with your PCP in 5 to 7 days for reevaluation. Contact a family practice at your convenience to get connected with a PCP if you do not have one.   Please do not hesitate to return to emergency department if worrisome signs symptoms we discussed become apparent.

## 2022-06-09 NOTE — ED Triage Notes (Signed)
Patient reports having N/V/D for a week. Patient states she is unable to keep anything down and has not eaten anything in a week. Patient reports feeling like she had a fever at home. Patient reports pelvic pain 8/10.

## 2022-06-09 NOTE — ED Notes (Signed)
D/c instructions d/w pt including, findings, medications, follow up, pain management and return precautions. Pt verbalized understanding of the above. Pt ambulatory out of department.

## 2022-06-09 NOTE — ED Provider Notes (Signed)
[facility]-EMERGENCY DEPT Provider Note   CSN: [id number] Arrival date & time: 06/09/22  0325     History  Chief Complaint  Patient presents with   Nausea   Diarrhea   Abdominal Pain    Jennifer Merritt is a 21 y.o. female.   Diarrhea Associated symptoms: abdominal pain and vomiting   Abdominal Pain Associated symptoms: diarrhea, hematuria, nausea and vomiting    Patient is a 21 year old with past medical history of gastric bypass in 2019, tubo-ovarian abscess presented to the emergency department for evaluation of abdominal pain and diarrhea.  Patient states she has had abdominal pain and diarrhea for about a week.  Patient described the pain as dull, constant, located on lower abdomen. Patient states she also has fever at home on 3 readings in the last few days.  She has watery diarrhea without any blood.  Yesterday she noticed blood in her urine without increased urinary frequency and urgency..  She states that today she has 1 episode of vomiting with blood.  Last menstrual cycle was 2 weeks ago, regular.  Denies chest pains, shortness of breath, headache. States she has not tolerated any food or drink for about a week.  States she has had intermittent abdominal pain since her gastric bypass in 2019.  She has tried Tylenol at home but was unable to tolerate P.O.    Home Medications Prior to Admission medications   Not on File      Allergies    Patient has no known allergies.    Review of Systems   Review of Systems  Gastrointestinal:  Positive for abdominal pain, diarrhea, nausea and vomiting.  Genitourinary:  Positive for hematuria.    Physical Exam Updated Vital Signs BP 126/77 (BP Location: Left Arm)   Pulse 70   Temp 98.6 F (37 C) (Oral)   Resp 18   Ht 5\' 2"  (1.575 m)   Wt 81.6 kg   LMP 05/26/2022 (Exact Date)   SpO2 98%   BMI 32.92 kg/m  Physical Exam Vitals and nursing note reviewed.  Constitutional:      Appearance: Normal  appearance.  HENT:     Head: Normocephalic and atraumatic.     Mouth/Throat:     Mouth: Mucous membranes are moist.  Eyes:     General: No scleral icterus. Cardiovascular:     Rate and Rhythm: Normal rate and regular rhythm.     Pulses: Normal pulses.     Heart sounds: Normal heart sounds.  Pulmonary:     Effort: Pulmonary effort is normal.     Breath sounds: Normal breath sounds.  Abdominal:     General: Abdomen is flat. Bowel sounds are normal.     Tenderness: There is abdominal tenderness in the right lower quadrant, suprapubic area and left lower quadrant. There is guarding.  Musculoskeletal:        General: No deformity.  Skin:    General: Skin is warm.     Findings: No rash.  Neurological:     General: No focal deficit present.     Mental Status: She is alert.  Psychiatric:        Mood and Affect: Mood normal.     ED Results / Procedures / Treatments   Labs (all labs ordered are listed, but only abnormal results are displayed) Labs Reviewed  CBC - Abnormal; Notable for the following components:      Result Value   MCV 79.5 (*)    MCH 25.4 (*)  All other components within normal limits  URINALYSIS, ROUTINE W REFLEX MICROSCOPIC - Abnormal; Notable for the following components:   APPearance HAZY (*)    Specific Gravity, Urine 1.031 (*)    Ketones, ur 5 (*)    Protein, ur 30 (*)    Bacteria, UA RARE (*)    All other components within normal limits  LIPASE, BLOOD  COMPREHENSIVE METABOLIC PANEL  I-STAT BETA HCG BLOOD, ED ([facility], [facility], [facility] ONLY)    EKG None  Radiology No results found.  Procedures Procedures    Medications Ordered in ED Medications  ondansetron (ZOFRAN-ODT) disintegrating tablet 4 mg (4 mg Oral Given 06/09/22 9485)    ED Course/ Medical Decision Making/ A&P Clinical Course as of 06/09/22 0708  Thu Jun 09, 2022  0500 CT ABDOMEN PELVIS W CONTRAST [KL]  0704 Clue Cells Wet Prep HPF POC(!): PRESENT [KL]    Clinical Course User Index [KL]  Rex Kras, PA                           Medical Decision Making Amount and/or Complexity of Data Reviewed Labs: ordered. Decision-making details documented in ED Course. Radiology: ordered. Decision-making details documented in ED Course.  Risk Prescription drug management.   This patient presents to the ED for concern of lower abdominal pain, diarrhea, this involves an extensive number of treatment options, and is a complaint that carries with it a high risk of complications and morbidity.  The differential diagnosis includes gastroenteritis, diverticulitis, appendicitis, ovarian torsion, ovarian cysts, PID, UTI, etc.   Co morbidities that complicate the patient evaluation  See HPI   Additional history obtained:  Additional history obtained from EMR External records from outside source obtained and reviewed including Care Everywhere/External Records and Primary Care Documents  Lab Tests:  I Ordered, and personally interpreted labs.  The pertinent results include:  No leukocytosis noted.  No evidence of anemia.  Platelets within normal range.  No electrolyte abnormalities noted.  Renal function within normal limits.  No transaminitis noted.  Lipase within normal limits.  UA significant for positive ketones, protein 30, otherwise unremarkable.   Imaging Studies ordered:  I ordered imaging studies including CT abdomen pelvis with contrast I independently visualized and interpreted imaging which showed Fluid throughout nondilated small and large loops in the abdomen and pelvis compatible with Enteritis/Diarrhea. No discrete bowel inflammation or evidence of bowel obstruction. Prior Roux-en-Y type gastric bypass.  I agree with the radiologist interpretation   Cardiac Monitoring: / EKG:  The patient was maintained on a cardiac monitor.  I personally viewed and interpreted the cardiac monitored which showed an underlying rhythm of: sinus rhythm.   Consultations  Obtained:  N/a   Problem List / ED Course / Critical interventions / Medication management  Diarrhea Vitals signs within normal range and stable throughout visit. Laboratory/imaging studies significant for: See above On physical examination, pt is afebrile and acute in no acute distress. Ct scan showed fluid throughout nondilated small and large loops in the abdomen and pelvis compatible with Enteritis/Diarrhea. Positive for clue cells on wet prep.  However patient is asymptomatic we will proceed with conservative therapy.  Prescribed Zofran for nausea and recommended OTC antidiarrhea medication, Tylenol/ibuprofen for pain. I ordered medication including Toradol and Zofran for pain and nausea. Reevaluation of the patient after these medicines showed that the patient improved I have reviewed the patients home medicines and have made adjustments as needed    Social  Determinants of Health:  N/a   Test / Admission - Considered:  Continued outpatient therapy with the same. Follow-up with PCP recommended for reevaluation of symptoms. Treatment plan discussed with patient.  Pt acknowledged understanding was agreeable to the plan. Worrisome signs and symptoms were discussed with patient, and patient acknowledged understanding to return to the ED if they noticed these signs and symptoms. Patient was stable upon discharge.          Final Clinical Impression(s) / ED Diagnoses Final diagnoses:  None    Rx / DC Orders ED Discharge Orders     None         Jeanelle Malling, [place] 06/09/22 1444    Dione Booze, MD 06/09/22 2240

## 2022-06-09 NOTE — ED Notes (Signed)
Went to lobby to call patient, pt answers call for triage but tells this RN that she would not like to stay for evaluation or triage. States intent to leave at this time. A&Ox4, ambulatory, does not appear in acute distress during conversation.

## 2022-06-10 LAB — GC/CHLAMYDIA PROBE AMP (~~LOC~~) NOT AT ARMC
Chlamydia: NEGATIVE
Comment: NEGATIVE
Comment: NORMAL
Neisseria Gonorrhea: NEGATIVE

## 2023-04-07 ENCOUNTER — Encounter (HOSPITAL_COMMUNITY): Payer: Self-pay

## 2023-04-07 ENCOUNTER — Emergency Department (HOSPITAL_COMMUNITY)
Admission: EM | Admit: 2023-04-07 | Discharge: 2023-04-08 | Disposition: A | Discharge status: Home / Self Care | Payer: Self-pay | Attending: Emergency Medicine | Admitting: Emergency Medicine

## 2023-04-07 DIAGNOSIS — R109 Unspecified abdominal pain: Secondary | ICD-10-CM | POA: Insufficient documentation

## 2023-04-07 LAB — CBC WITH DIFFERENTIAL/PLATELET
Abs Immature Granulocytes: 0.01 10*3/uL (ref 0.00–0.07)
Basophils Absolute: 0 10*3/uL (ref 0.0–0.1)
Basophils Relative: 1 %
Eosinophils Absolute: 0.1 10*3/uL (ref 0.0–0.5)
Eosinophils Relative: 1 %
HCT: 33.2 % — ABNORMAL LOW (ref 36.0–46.0)
Hemoglobin: 10.4 g/dL — ABNORMAL LOW (ref 12.0–15.0)
Immature Granulocytes: 0 %
Lymphocytes Relative: 42 %
Lymphs Abs: 2.2 10*3/uL (ref 0.7–4.0)
MCH: 27.2 pg (ref 26.0–34.0)
MCHC: 31.3 g/dL (ref 30.0–36.0)
MCV: 86.7 fL (ref 80.0–100.0)
Monocytes Absolute: 0.5 10*3/uL (ref 0.1–1.0)
Monocytes Relative: 9 %
Neutro Abs: 2.5 10*3/uL (ref 1.7–7.7)
Neutrophils Relative %: 47 %
Platelets: 256 10*3/uL (ref 150–400)
RBC: 3.83 MIL/uL — ABNORMAL LOW (ref 3.87–5.11)
RDW: 13.4 % (ref 11.5–15.5)
WBC: 5.3 10*3/uL (ref 4.0–10.5)
nRBC: 0 % (ref 0.0–0.2)

## 2023-04-07 LAB — BASIC METABOLIC PANEL
Anion gap: 7 (ref 5–15)
BUN: 9 mg/dL (ref 6–20)
CO2: 25 mmol/L (ref 22–32)
Calcium: 8.4 mg/dL — ABNORMAL LOW (ref 8.9–10.3)
Chloride: 106 mmol/L (ref 98–111)
Creatinine, Ser: 0.75 mg/dL (ref 0.44–1.00)
GFR, Estimated: >60 mL/min (ref >60)
Glucose, Bld: 101 mg/dL — ABNORMAL HIGH (ref 70–99)
Potassium: 3.8 mmol/L (ref 3.5–5.1)
Sodium: 138 mmol/L (ref 135–145)

## 2023-04-07 NOTE — ED Triage Notes (Signed)
Pt is having lower abd pain that is felt bilaterally and it comes in waves. Pain is described as sharp and migrates from the life side of her pelvis to the right. Has a history of urterine cyst and she endorses that it feels like when she had them before. States no chance of her being pregnant, currently on her period.

## 2023-04-07 NOTE — ED Provider Triage Note (Signed)
Emergency Medicine Provider Triage Evaluation Note  Jennifer Merritt , a 22 y.o. female  was evaluated in triage.  Pt complains of complains of bilateral lower abdominal pain.  She has a history of cyst in her pelvis.  She is currently menstruating and states she thinks that it is making it worse..  Review of Systems  Positive: Pelvic pain Negative: Urinary symptoms  Physical Exam  BP 120/72 (BP Location: Left Arm)   Pulse 69   Temp 98.5 F (36.9 C) (Oral)   Resp 20   SpO2 100%  Gen:   Awake, no distress   Resp:  Normal effort  MSK:   Moves extremities without difficulty  Other:    Medical Decision Making  Medically screening exam initiated at 9:26 PM.  Appropriate orders placed.  Jennifer Merritt was informed that the remainder of the evaluation will be completed by another provider, this initial triage assessment does not replace that evaluation, and the importance of remaining in the ED until their evaluation is complete.     Arthor Captain, PA-C 04/07/23 2127

## 2023-04-08 LAB — PREGNANCY, URINE: Preg Test, Ur: NEGATIVE

## 2023-04-08 MED ORDER — DICYCLOMINE HCL 10 MG/ML IM SOLN
20.0000 mg | Freq: Once | INTRAMUSCULAR | Status: AC
  Administered 2023-04-08: 20 mg via INTRAMUSCULAR
  Filled 2023-04-08: qty 2

## 2023-04-08 MED ORDER — DICYCLOMINE HCL 20 MG PO TABS: 10.0000 mg | ORAL_TABLET | Freq: Two times a day (BID) | ORAL | 0 refills | Status: AC | PRN

## 2023-04-08 NOTE — ED Provider Notes (Signed)
Jennifer Merritt   CSN: [id number] Arrival date & time: 04/07/23  2026     History  Chief Complaint  Patient presents with   Abdominal Pain    Jennifer Merritt is a 22 y.o. female.  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  She reports recurrent episodes of sharp pains which come and go without known alleviating factors.  She states that she has had similar pain for more than 2 years.  It is often worse during her menstrual cycle.  She is currently menstruating.  She takes Tylenol for her pain with little improvement.  No new/worsening vomiting, bowel changes, urinary symptoms, fever.  Unable to articulate what prompted ED presentation this evening in light of her symptoms chronicity.  The history is provided by the patient. No language interpreter was used.  Abdominal Pain      Home Medications Prior to Admission medications   Medication Sig Start Date End Date Taking? Authorizing Provider  dicyclomine (BENTYL) 20 MG tablet Take 0.5-1 tablets (10-20 mg total) by mouth every 12 (twelve) hours as needed for spasms. 04/08/23  Yes Antony Madura, PA-C  ondansetron (ZOFRAN) 4 MG tablet Take 1 tablet (4 mg total) by mouth every 6 (six) hours. Patient not taking: Reported on 04/07/2023 06/09/22   Jeanelle Malling, PA      Allergies    Patient has no known allergies.    Review of Systems   Review of Systems  Gastrointestinal:  Positive for abdominal pain.  Ten systems reviewed and are negative for acute change, except as noted in the HPI.    Physical Exam Updated Vital Signs BP (!) 117/58 (BP Location: Right Arm)   Pulse (!) 51   Temp 98 F (36.7 C) (Oral)   Resp 19   SpO2 100%   Physical Exam Vitals and nursing Merritt reviewed.  Constitutional:      General: She is not in acute distress.    Appearance: She is well-developed. She is not diaphoretic.     Comments:  Nontoxic appearing, pleasant female.  HENT:     Head: Normocephalic and atraumatic.  Eyes:     General: No scleral icterus.    Extraocular Movements: EOM normal.     Conjunctiva/sclera: Conjunctivae normal.  Cardiovascular:     Rate and Rhythm: Normal rate and regular rhythm.     Pulses: Normal pulses.  Pulmonary:     Effort: Pulmonary effort is normal. No respiratory distress.     Comments: Respirations even and unlabored Abdominal:     Palpations: Abdomen is soft. There is no mass.     Tenderness: There is no guarding.     Comments: Abdomen soft, obese.  No palpable masses or peritoneal signs.  No focal tenderness on exam.  Musculoskeletal:        General: Normal range of motion.     Cervical back: Normal range of motion.  Skin:    General: Skin is warm and dry.     Coloration: Skin is not pale.     Findings: No erythema or rash.  Neurological:     Mental Status: She is alert and oriented to person, place, and time.     Coordination: Coordination normal.  Psychiatric:        Mood and Affect: Mood and affect normal.        Behavior: Behavior normal.     ED Results /  Procedures / Treatments   Labs (all labs ordered are listed, but only abnormal results are displayed) Labs Reviewed  URINALYSIS, ROUTINE W REFLEX MICROSCOPIC - Abnormal; Notable for the following components:      Result Value   APPearance HAZY (*)    Nitrite POSITIVE (*)    Bacteria, UA FEW (*)    All other components within normal limits  BASIC METABOLIC PANEL - Abnormal; Notable for the following components:   Glucose, Bld 101 (*)    Calcium 8.4 (*)    All other components within normal limits  CBC WITH DIFFERENTIAL/PLATELET - Abnormal; Notable for the following components:   RBC 3.83 (*)    Hemoglobin 10.4 (*)    HCT 33.2 (*)    All other components within normal limits  PREGNANCY, URINE    EKG None  Radiology No results found.  Procedures Procedures    Medications Ordered in  ED Medications  dicyclomine (BENTYL) injection 20 mg (20 mg Intramuscular Given 04/08/23 0053)    ED Course/ Medical Decision Making/ A&P Clinical Course as of 04/08/23 0244  Sat Apr 08, 2023  0214 Pain resolved with Bentyl. Patient comfortable with discharge. [KH]    Clinical Course User Index [KH] Antony Madura, PA-C                             Medical Decision Making Amount and/or Complexity of Data Reviewed Labs: ordered.  Risk Prescription drug management.   This patient presents to the ED for concern of abdominal pain, this involves an extensive number of treatment options, and is a complaint that carries with it a high risk of complications and morbidity.  The differential diagnosis includes pain from adhesions vs pSBO vs SBO vs dysmenorrhea vs constipation vs ruptured viscous   Co morbidities that complicate the patient evaluation  Roux-en-Y bypass Tummy tuck   Additional history obtained:  External records from outside source obtained and reviewed including Pelvic US in May and June 2021 showing ovarian cyst vs TOA and subsequent resolution   Lab Tests:  I Ordered, and personally interpreted labs.  The pertinent results include:  Hgb 10.4 (stable chronic anemia), UA nitrite positive (likely aberrant value). No pyuria or bacteriuria.   Cardiac Monitoring:  The patient was maintained on a cardiac monitor.  I personally viewed and interpreted the cardiac monitored which showed an underlying rhythm of: NSR   Medicines ordered and prescription drug management:  I ordered medication including Bentyl for abdominal cramping  Reevaluation of the patient after these medicines showed that the patient resolved I have reviewed the patients home medicines and have made adjustments as needed   Test Considered:  Acute abdominal series   Problem List / ED Course:  Patient presenting for abdominal pain.  This is migratory and generalized.  It has been intermittent  for more than 2 years.  Unclear why patient decided to come to the ED today versus following up with a primary doctor. She is afebrile with stable vital signs.  No criteria for SIRS/sepsis.  Laboratory evaluation was initiated which is stable compared to her baseline values.  She has no focal abdominal tenderness or signs of acute surgical abdomen.  Do not believe further emergent workup is indicated, especially in light of symptom chronicity. She has had complete resolution of her abdominal discomfort with Bentyl.  I have discussed with the patient that her chronic abdominal pain may be related to her prior Roux-en-Y  bypass and tummy tuck with respect to residual adhesions.  Presently there is no clinical concern for partial bowel obstruction or complete small bowel obstruction.  Stable for discharge to follow-up with a primary doctor.   Reevaluation:  After the interventions noted above, I reevaluated the patient and found that they have :improved   Social Determinants of Health:  Lives independently    Dispostion:  After consideration of the diagnostic results and the patients response to treatment, I feel that the patent would benefit from PCP f/u for repeat assessment/recheck. Given short course of Bentyl for PRN use. Return precautions discussed and provided. Patient discharged in stable condition with no unaddressed concerns.          Final Clinical Impression(s) / ED Diagnoses Final diagnoses:  Abdominal pain, unspecified abdominal location    Rx / DC Orders ED Discharge Orders          Ordered    dicyclomine (BENTYL) 20 MG tablet  Every 12 hours PRN        04/08/23 0215              Antony Madura, PA-C 04/08/23 0249    Wilkie Aye, Mayer Masker, MD 04/11/23 [phone]

## 2023-04-08 NOTE — Discharge Instructions (Signed)
Use Bentyl as needed for management of recurrent abdominal discomfort.  We advise follow-up with a primary care doctor.  You may return for new or concerning symptoms.

## 2023-04-08 NOTE — ED Notes (Signed)
Urine pregnancy test not crossing over into pt chart from machine. Final result is negative.
